# Patient Record
Sex: Male | Born: 1955 | Race: Black or African American | Hispanic: No | Marital: Single | Smoking: Former smoker
Health system: Southern US, Community
[De-identification: ages and names within clinical notes are randomized; demographics above are authoritative.]

## PROBLEM LIST (undated history)

## (undated) DIAGNOSIS — M109 Gout, unspecified: Secondary | ICD-10-CM

## (undated) DIAGNOSIS — I499 Cardiac arrhythmia, unspecified: Secondary | ICD-10-CM

## (undated) HISTORY — PX: CARDIOVERSION: SHX1299

---

## 2019-07-29 ENCOUNTER — Inpatient Hospital Stay (HOSPITAL_COMMUNITY)
Admission: EM | Admit: 2019-07-29 | Discharge: 2019-07-31 | DRG: 176 | Disposition: A | Discharge status: Home / Self Care | Payer: Self-pay | Attending: Family Medicine | Admitting: Family Medicine

## 2019-07-29 ENCOUNTER — Encounter (HOSPITAL_COMMUNITY): Payer: Self-pay | Admitting: Family Medicine

## 2019-07-29 ENCOUNTER — Other Ambulatory Visit: Payer: Self-pay

## 2019-07-29 ENCOUNTER — Emergency Department (HOSPITAL_COMMUNITY): Payer: Self-pay

## 2019-07-29 DIAGNOSIS — I2694 Multiple subsegmental pulmonary emboli without acute cor pulmonale: Principal | ICD-10-CM | POA: Diagnosis present

## 2019-07-29 DIAGNOSIS — Y92009 Unspecified place in unspecified non-institutional (private) residence as the place of occurrence of the external cause: Secondary | ICD-10-CM

## 2019-07-29 DIAGNOSIS — Z20822 Contact with and (suspected) exposure to covid-19: Secondary | ICD-10-CM | POA: Diagnosis present

## 2019-07-29 DIAGNOSIS — I2699 Other pulmonary embolism without acute cor pulmonale: Secondary | ICD-10-CM | POA: Diagnosis present

## 2019-07-29 DIAGNOSIS — J410 Simple chronic bronchitis: Secondary | ICD-10-CM | POA: Diagnosis present

## 2019-07-29 DIAGNOSIS — I445 Left posterior fascicular block: Secondary | ICD-10-CM | POA: Diagnosis present

## 2019-07-29 DIAGNOSIS — I4510 Unspecified right bundle-branch block: Secondary | ICD-10-CM | POA: Diagnosis present

## 2019-07-29 DIAGNOSIS — F17290 Nicotine dependence, other tobacco product, uncomplicated: Secondary | ICD-10-CM | POA: Diagnosis present

## 2019-07-29 DIAGNOSIS — Z86718 Personal history of other venous thrombosis and embolism: Secondary | ICD-10-CM

## 2019-07-29 DIAGNOSIS — Z716 Tobacco abuse counseling: Secondary | ICD-10-CM

## 2019-07-29 DIAGNOSIS — I2601 Septic pulmonary embolism with acute cor pulmonale: Secondary | ICD-10-CM

## 2019-07-29 DIAGNOSIS — Z91120 Patient's intentional underdosing of medication regimen due to financial hardship: Secondary | ICD-10-CM

## 2019-07-29 DIAGNOSIS — R0600 Dyspnea, unspecified: Secondary | ICD-10-CM

## 2019-07-29 DIAGNOSIS — Z8679 Personal history of other diseases of the circulatory system: Secondary | ICD-10-CM | POA: Insufficient documentation

## 2019-07-29 DIAGNOSIS — J189 Pneumonia, unspecified organism: Secondary | ICD-10-CM

## 2019-07-29 DIAGNOSIS — T45516A Underdosing of anticoagulants, initial encounter: Secondary | ICD-10-CM | POA: Diagnosis present

## 2019-07-29 DIAGNOSIS — F17210 Nicotine dependence, cigarettes, uncomplicated: Secondary | ICD-10-CM | POA: Diagnosis present

## 2019-07-29 DIAGNOSIS — R7401 Elevation of levels of liver transaminase levels: Secondary | ICD-10-CM | POA: Diagnosis present

## 2019-07-29 DIAGNOSIS — Z833 Family history of diabetes mellitus: Secondary | ICD-10-CM

## 2019-07-29 DIAGNOSIS — I4891 Unspecified atrial fibrillation: Secondary | ICD-10-CM | POA: Diagnosis present

## 2019-07-29 DIAGNOSIS — I77810 Thoracic aortic ectasia: Secondary | ICD-10-CM | POA: Diagnosis present

## 2019-07-29 HISTORY — DX: Cardiac arrhythmia, unspecified: I49.9

## 2019-07-29 LAB — URINALYSIS, ROUTINE W REFLEX MICROSCOPIC
Bilirubin Urine: NEGATIVE
Glucose, UA: NEGATIVE mg/dL
Ketones, ur: NEGATIVE mg/dL
Nitrite: NEGATIVE
Protein, ur: 100 mg/dL — AB
Specific Gravity, Urine: >1.046 — ABNORMAL HIGH (ref 1.005–1.030)
pH: 6 (ref 5.0–8.0)

## 2019-07-29 LAB — LACTIC ACID, PLASMA: Lactic Acid, Venous: 1.3 mmol/L (ref 0.5–1.9)

## 2019-07-29 LAB — CBC
HCT: 45.6 % (ref 39.0–52.0)
Hemoglobin: 14.2 g/dL (ref 13.0–17.0)
MCH: 28.2 pg (ref 26.0–34.0)
MCHC: 31.1 g/dL (ref 30.0–36.0)
MCV: 90.5 fL (ref 80.0–100.0)
Platelets: 239 10*3/uL (ref 150–400)
RBC: 5.04 MIL/uL (ref 4.22–5.81)
RDW: 15.9 % — ABNORMAL HIGH (ref 11.5–15.5)
WBC: 11.4 10*3/uL — ABNORMAL HIGH (ref 4.0–10.5)
nRBC: 0 % (ref 0.0–0.2)

## 2019-07-29 LAB — BASIC METABOLIC PANEL
Anion gap: 13 (ref 5–15)
BUN: 6 mg/dL — ABNORMAL LOW (ref 8–23)
CO2: 24 mmol/L (ref 22–32)
Calcium: 10.1 mg/dL (ref 8.9–10.3)
Chloride: 100 mmol/L (ref 98–111)
Creatinine, Ser: 1.07 mg/dL (ref 0.61–1.24)
GFR calc Af Amer: >60 mL/min (ref >60)
GFR calc non Af Amer: >60 mL/min (ref >60)
Glucose, Bld: 120 mg/dL — ABNORMAL HIGH (ref 70–99)
Potassium: 3.6 mmol/L (ref 3.5–5.1)
Sodium: 137 mmol/L (ref 135–145)

## 2019-07-29 LAB — HEPARIN LEVEL (UNFRACTIONATED): Heparin Unfractionated: 0.46 IU/mL (ref 0.30–0.70)

## 2019-07-29 LAB — POC SARS CORONAVIRUS 2 AG -  ED: SARS Coronavirus 2 Ag: NEGATIVE

## 2019-07-29 LAB — TROPONIN I (HIGH SENSITIVITY)
Troponin I (High Sensitivity): 14 ng/L (ref <18)
Troponin I (High Sensitivity): 14 ng/L (ref <18)

## 2019-07-29 LAB — RESPIRATORY PANEL BY RT PCR (FLU A&B, COVID)
Influenza A by PCR: NEGATIVE
Influenza B by PCR: NEGATIVE
SARS Coronavirus 2 by RT PCR: NEGATIVE

## 2019-07-29 LAB — BRAIN NATRIURETIC PEPTIDE: B Natriuretic Peptide: 55.7 pg/mL (ref 0.0–100.0)

## 2019-07-29 LAB — HIV ANTIBODY (ROUTINE TESTING W REFLEX): HIV Screen 4th Generation wRfx: NONREACTIVE

## 2019-07-29 MED ORDER — SODIUM CHLORIDE 0.9 % IV SOLN
1.0000 g | INTRAVENOUS | Status: DC
  Administered 2019-07-29: 1 g via INTRAVENOUS
  Filled 2019-07-29: qty 10

## 2019-07-29 MED ORDER — HEPARIN (PORCINE) 25000 UT/250ML-% IV SOLN
1600.0000 [IU]/h | INTRAVENOUS | Status: DC
  Administered 2019-07-29 – 2019-07-30 (×2): 1600 [IU]/h via INTRAVENOUS
  Filled 2019-07-29 (×2): qty 250

## 2019-07-29 MED ORDER — SODIUM CHLORIDE 0.9% FLUSH
3.0000 mL | Freq: Once | INTRAVENOUS | Status: AC
  Administered 2019-07-29: 3 mL via INTRAVENOUS

## 2019-07-29 MED ORDER — ACETAMINOPHEN 325 MG PO TABS: 650.0000 mg | ORAL_TABLET | Freq: Four times a day (QID) | ORAL | Status: DC | PRN

## 2019-07-29 MED ORDER — NICOTINE 7 MG/24HR TD PT24
7.0000 mg | MEDICATED_PATCH | Freq: Every day | TRANSDERMAL | Status: DC
  Administered 2019-07-29 – 2019-07-31 (×3): 7 mg via TRANSDERMAL
  Filled 2019-07-29 (×3): qty 1

## 2019-07-29 MED ORDER — LIDOCAINE 5 % EX PTCH
1.0000 | MEDICATED_PATCH | CUTANEOUS | Status: DC
  Administered 2019-07-29 – 2019-07-31 (×3): 1 via TRANSDERMAL
  Filled 2019-07-29 (×5): qty 1

## 2019-07-29 MED ORDER — SODIUM CHLORIDE 0.9 % IV SOLN
500.0000 mg | INTRAVENOUS | Status: DC
  Administered 2019-07-29: 500 mg via INTRAVENOUS
  Filled 2019-07-29: qty 500

## 2019-07-29 MED ORDER — HEPARIN BOLUS VIA INFUSION
7000.0000 [IU] | Freq: Once | INTRAVENOUS | Status: AC
  Administered 2019-07-29: 7000 [IU] via INTRAVENOUS
  Filled 2019-07-29: qty 7000

## 2019-07-29 MED ORDER — IOHEXOL 350 MG/ML SOLN
100.0000 mL | Freq: Once | INTRAVENOUS | Status: AC | PRN
  Administered 2019-07-29: 100 mL via INTRAVENOUS

## 2019-07-29 NOTE — ED Notes (Signed)
Tele Dinner ordered 

## 2019-07-29 NOTE — H&P (Addendum)
Family Medicine Teaching [facility] Admission History and Physical Service Pager: [phone]  Patient name: Patrick Warner Medical record number: [id number] Date of birth: 12/11/1955 Age: 64 y.o. Gender: male  Primary Care Provider: Patient, No Pcp Per Consultants: None Code Status: Full code Preferred Emergency Contact:  Contact Information    Name Relation Home Work Mobile   Foley,shanika Daughter   [phone]     Chief Complaint: SOB  Major events 5/5: Admitted for pulmonary emboli, heparin drip started, azithromycin and ceftriaxone for CAP  Assessment and Plan: Patrick Warner is a 64 y.o. male presenting with SOB, found to have PE. PMH is significant for afib and DVT.    Dyspnea 2/2 Submassive Bilateral Pulmonary Emboli Patient presented to ED with shortness of breath which started suddenly 5 days ago. Initial vitals significant for tachycardia to 106, and tachypnea a up to 37.   On exam, patient noted to have some decreased basal lung sounds bilaterally and is on 2 L via nasal cannula.  Patient does not use supplemental oxygen at home. CTA confirmed extensive bilateral lobar, segmental, subsegmental PEs in the bilateral lower lobe pulmonary arteries, right worse than left.  CTA also showed RV to LV ratio of 1.0 and RBBB indicating right heart strain from submassive PE. pt placed on heparin drip in the ED.  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 No swelling/pain in any extremity to indicate where clot originated this time.  ChadsVasc score of 2. CXR/CTA indicated possibility of pneumonia and pt was given  CTX and azithromycin in the ED, but given lack of fever/leukocytosis or other signs of PNA, this is unlikely. Will d/c abx.  Patient requires admission for anticoagulation initiation.  Given his lack of insurance and PCP, warfarin will be the most likely [facility] used as outpatient. Will need to bridge to  warfarin while in hospital.   - admit to MedSurg, attending Dr. Pollie Meyer  - cardiac monitoring  - continuous pulse oximetry  - Echocardiogram - Continue heparin drip, will transition to oral anticoagulation likely  Coumadin  - Heparin per pharmacy - HIV, heparin level, lipid panel, PT/INR, U/A, CBC, CMP, hemoglobin A1c - consult to [facility] for PCP needs.  - vitals per unit  - Up with assistance - PT/OT - Tylenol PRN   Questionable hx of Atrial fibrillation Patient reports history of heart fibrillation and states that he was previously prescribed Coumadin in 2017 but stopped taking it due to cost. Patient has not been anticoagulated since then as he takes no regular medications. Appears to be in regular rhythm today.  No previous ekg's/notes to refer to.   -Patient currently on heparin drip for pulmonary emboli. See [facility] plan above.  -Patient on cardiac monitoring -A.m. EKG   History of left lower extremity DVT?  LUE pain   Patient also reports history of DVT and left lower extremity.  Do not note any erythema or edema in left lower extremity on admission today, patient does not report any tenderness to palpation of this extremity.  Patient does state that he has soreness in his left upper extremity near his olecranon with no signs of edema, skin changes nor erythema. Likely MSK in nature -Tylenol PRN  -lidocaine patch PRN  -K pad  -Consider DVT ultrasound of left upper extremity  Tobacco use disorder Pt smokes approximately 2/3rds of a pack per day. Contributing factor to his PE.  - encourage  smoking cessation - nicotine replacement therapy.   Patient without primary care physician Patient states that he has not seen a primary care physician nor cardiologist since 2017. -Consult TOC for PCP.  FEN/GI: Regular diet  Prophylaxis: On anticoagulation, heparin  Disposition: Admit to MedSurg   History of Present Illness:  Patrick Warner is a 64 y.o. male presenting with shortness  of breath found to have bilateral pulmonary embolism and pneumonia.  Has medical history significant for abnormal heart rhythm.  Patient states that he was sitting in a chair at home 5 days ago when he suddenly started feeling short of breath.  Patient's daughter is at bedside and reports that the patient complained of shortness of breath but wanted to wait until the next weekend before being evaluated.  When asked if patient feels shortness of breath has progressively worsened, he reports it feels " about the same".  Patient reports no previous pulmonary emboli but does state that about 4 years ago he was told that he had clots and he is left lower extremity and at that time was told he had a "fibrillation in my heart".  Patient also adds that he was prescribed warfarin at that time but had to stop taking it as he could no longer afford the medication.  Patient reports that he does not have a PCP or cardiologist at this time.  Reports that the last time he saw a cardiologist was around 2017 in [facility]  He now lives in here in [place] with his 41 year old daughter and 20 year old granddaughter.  Patient denies taking any current medications.  Sometimes takes walks with granddaughter, does not spend most of time sitting throughout the day.  Patient denies recent long flight or car ride and reports that he ambulates independently.Denies any recent surgeries. Patient states that he has reports frequent swelling in left knee occurs intermittently in addition to some left hip pain.  He denies dizziness but does report that sometimes he feels that his left hip is not steady when he stands quickly.  Review of systems notable for no known sick contacts and recently developed hemoptysis later in the afternoon 5 days ago that has been persistent.  Reports a chronic cough with phlegm but never has been bloody, until this week.    No known drug allergies  ED Course:  Patient vitals remarkable for  tachycardia to 106 on initial presentation, tachypnea to 22 and normotensive. Pulmonary embolism confirmed on CTA noted as  extensive bilateral lobar, segmental, and subsegmental pulmonary emboli within the bilateral lower lobe pulmonary arteries, right worse than left. There is also involvement of a segmental branch artery of the right upper lobe. Patient was started on heparin gtt and treated with azithromycin and CTX for presumed CAP with CXR findings of  Ill-defined airspace opacity in the right mid and lower lung regions consistent pneumonia involving several lung segments. Bibasilar atelectasis with small left pleural effusion.  Review Of Systems: Per HPI with the following additions:   Review of Systems  Constitutional: Positive for appetite change. Negative for chills, fever and unexpected weight change.  HENT: Negative for rhinorrhea, sneezing and sore throat.   Eyes: Negative for pain.  Respiratory: Positive for cough, shortness of breath and wheezing.        Hemoptysis   Cardiovascular: Positive for palpitations. Negative for chest pain.  Gastrointestinal: Negative for abdominal pain, blood in stool, constipation, diarrhea and nausea.  Genitourinary: Negative for dysuria and hematuria.  Musculoskeletal: Positive for back pain.  Skin: Negative for rash.  Neurological: Negative for seizures, weakness and headaches.     Patient Active Problem List   Diagnosis Date Noted  . Pulmonary embolism (HCC) 07/29/2019  . Dyspnea   . Hx of atrial fibrillation without current medication   . Community acquired pneumonia of right lower lobe of lung     Past Medical History: Past Medical History:  Diagnosis Date  . Abnormal heart rhythm    "fibrillation of heart", diagnosed 4 years ago    Past Surgical History: Past Surgical History:  Procedure Laterality Date  . CARDIOVERSION     around 2017    Social History: Social History   Tobacco Use  . Smoking status: Current Every Day  Smoker    Packs/day: 0.50    Years: 45.00    Pack years: 22.50    Types: Cigarettes, Cigars  . Smokeless tobacco: Never Used  Substance Use Topics  . Alcohol use: Yes    Alcohol/week: 12.0 standard drinks    Types: 12 Cans of beer per week    Comment: 4 drinks per day on weekends   . Drug use: Never   Additional social history: patient lives with his daughter and granddaughter in [place], unemployed currently last employed in 2020; ambulates independently   Family History: Family History  Problem Relation Age of Onset  . Diabetes Mother      Allergies and Medications: No Known Allergies No current facility-administered medications on file prior to encounter.   No current outpatient medications on file prior to encounter.    Objective: BP 125/90   Pulse 78   Temp 98.4 F (36.9 C) (Oral)   Resp (!) 21   Ht 6\' 2"  (1.88 m)   Wt 102.1 kg   SpO2 99%   BMI 28.89 kg/m   Exam: General: Male appearing stated age, lying in bed, in no acute distress, abnormal weight Eyes: Mild scleral, no conjunctival injection ENTM: No oropharyngeal erythema, some missing teeth on bottom row, no oral lesions appreciated Cardiovascular: Regular rate and rhythm, did not appreciate murmurs on admission exam, radial pulses palpable bilaterally Respiratory: Some decreased breath sounds at bases of lung fields, no wheezing, no increased work of breathing, patient stable on 2 L nasal cannula Gastrointestinal: No tenderness to palpation of abdomen in 4 quadrants, bowel sounds occasional, no surgical scarring MSK: Moves extremities with normal range of motion, did not note any abnormalities on visual inspection, left upper extremity somewhat limited range of motion secondary to pain per patient Derm: No unhealing wounds or lesions appreciated on exam Neuro: A&O x4 Psych: Pleasantly conversational, no pressured speech, thought content is linear  Labs and Imaging: CBC BMET  Recent Labs  Lab  07/29/19 0858  WBC 11.4*  HGB 14.2  HCT 45.6  PLT 239   Recent Labs  Lab 07/29/19 0858  NA 137  K 3.6  CL 100  CO2 24  BUN 6*  CREATININE 1.07  GLUCOSE 120*  CALCIUM 10.1     EKG: QTC 467, right bundle branch block, HR 87  DG Chest 2 View  Result Date: 07/29/2019 CLINICAL DATA:  Shortness of breath EXAM: CHEST - 2 VIEW COMPARISON:  None. FINDINGS: There is atelectatic change bilaterally with small left pleural effusion. There is ill-defined opacity in the right mid and lower lung regions. Heart size and pulmonary vascularity are normal. No adenopathy. There is degenerative change in the thoracic spine. IMPRESSION: Ill-defined airspace opacity in the right  mid and lower lung regions consistent pneumonia involving several lung segments. Bibasilar atelectasis with small left pleural effusion. Cardiac silhouette normal.  No adenopathy. These findings may warrant correlation with COVID-19 status. Electronically Signed   By: Lowella Grip III M.D.   On: 07/29/2019 09:36   CT Angio Chest PE W/Cm &/Or Wo Cm  Result Date: 07/29/2019 CLINICAL DATA:  Shortness of breath EXAM: CT ANGIOGRAPHY CHEST WITH CONTRAST TECHNIQUE: Multidetector CT imaging of the chest was performed using the standard protocol during bolus administration of intravenous contrast. Multiplanar CT image reconstructions and MIPs were obtained to evaluate the vascular anatomy. CONTRAST:  118mL OMNIPAQUE IOHEXOL 350 MG/ML SOLN COMPARISON:  X-ray 07/29/2019 FINDINGS: Cardiovascular: Prominent thrombus within the lobar, segmental, and subsegmental branches of the right lower lobe (series 6, image 68). Additional thrombi within segmental and subsegmental branch pulmonary arteries of the left lower lobe (series 6, image 74). Segmental branch right upper lobe filling defect (series 6, image 57). No left upper lobe pulmonary arterial filling defects. No saddle embolism. RV to LV ratio of 1.0 with flattening of the interventricular septum.  Heart size within normal limits. No pericardial effusion. Thoracic aorta is nonaneurysmal. Scattered coronary artery calcification. Mediastinum/Nodes: No enlarged mediastinal, hilar, or axillary lymph nodes. Thyroid gland, trachea, and esophagus demonstrate no significant findings. Lungs/Pleura: Patchy consolidation and ground-glass opacity within the right lower lobe. Extensive bibasilar atelectasis. No pleural effusion or pneumothorax. Upper Abdomen: No acute findings. Musculoskeletal: No chest wall abnormality. No acute or significant osseous findings. Review of the MIP images confirms the above findings. IMPRESSION: 1. Extensive bilateral lobar, segmental, and subsegmental pulmonary emboli within the bilateral lower lobe pulmonary arteries, right worse than left. There is also involvement of a segmental branch artery of the right upper lobe. 2. RV to LV ratio of 1.0, which can be seen with right heart strain. 3. Patchy consolidation and ground-glass opacity within the right lower lobe may represent pneumonia versus pulmonary infarction. 4. Extensive bibasilar atelectasis. These results were called by telephone at the time of interpretation on 07/29/2019 at 12:58 pm to provider Lacretia Leigh , who verbally acknowledged these results. Electronically Signed   By: Davina Poke D.O.   On: 07/29/2019 13:00    Stark Klein, MD 07/29/2019, 4:22 PM PGY-1, [place] Intern pager: [phone], text pages welcome  Resident Addendum I have separately seen and examined the patient.  I have discussed the findings and exam with the resident and agree with the above note.  I helped develop the management plan that is described in the resident's note and I agree with the content.  Changes have been made in BLUE.    Addison Naegeli, MD PGY-2 [facility] residency program

## 2019-07-29 NOTE — Progress Notes (Signed)
ANTICOAGULATION CONSULT NOTE  Pharmacy Consult for Heparin Indication: pulmonary embolus  No Known Allergies  Patient Measurements: Height: 6\' 2"  (188 cm) Weight: 102.1 kg (225 lb) IBW/kg (Calculated) : 82.2 Heparin Dosing Weight: 102.1 kg  Vital Signs: Temp: 97.7 F (36.5 C) (05/05 1208) Temp Source: Oral (05/05 1208) BP: 127/86 (05/05 1215) Pulse Rate: 77 (05/05 1215)  Labs: Recent Labs    07/29/19 0858 07/29/19 1140  HGB 14.2  --   HCT 45.6  --   PLT 239  --   CREATININE 1.07  --   TROPONINIHS 14 14    Estimated Creatinine Clearance: 91.3 mL/min (by C-G formula based on SCr of 1.07 mg/dL).   Medical History: No past medical history on file.  Medications:  Scheduled:  . heparin  7,000 Units Intravenous Once  . sodium chloride flush  3 mL Intravenous Once    Assessment: Patient is a 89 yom that presented to the ED with SOB. He has a Hx of PE in the past and was suppose to be on Warfarin for this however has not been complaint. Patient was found to have extensive PEs with suggestion of right heart strain. Pharmacy has been asked to dose heparin.   Goal of Therapy:  Heparin level 0.3-0.7 units/ml Monitor platelets by anticoagulation protocol: Yes   Plan:  - Heparin bolus 7000 units  IV x 1 dose - Heparin drip @ 1600 units/hr - Heparin level in ~ 6 hours  - Monitor patient for s/s of bleeding and CBC while on heparin   68 PharmD. BCPS  07/29/2019,1:12 PM .

## 2019-07-29 NOTE — ED Provider Notes (Signed)
[facility] EMERGENCY DEPARTMENT Provider Note   CSN: [id number] Arrival date & time: 07/29/19  1856     History Chief Complaint  Patient presents with  . Shortness of Breath    Patrick Warner is a 64 y.o. male.  64 year old male presents with several days of shortness of breath.  History of PE in the past which occurred several years ago and patient was told he needs to be alive time Coumadin but has not been compliant with this.  States has had pleuritic pain.  Has noted some pain to his left leg as well.  No fever or chills.  His dyspnea is worse with exertion.  Patient has had a slight productive cough.  Symptoms better with remaining still no treatment use prior to arrival        No past medical history on file.  There are no problems to display for this patient.        No family history on file.  Social History   Tobacco Use  . Smoking status: Not on file  Substance Use Topics  . Alcohol use: Not on file  . Drug use: Not on file    Home Medications Prior to Admission medications   Not on File    Allergies    Patient has no allergy information on record.  Review of Systems   Review of Systems  All other systems reviewed and are negative.   Physical Exam Updated Vital Signs BP 126/80   Pulse (!) 106   Temp 98.4 F (36.9 C)   Resp (!) 22   SpO2 97%   Physical Exam Vitals and nursing note reviewed.  Constitutional:      General: He is not in acute distress.    Appearance: Normal appearance. He is well-developed. He is not toxic-appearing.  HENT:     Head: Normocephalic and atraumatic.  Eyes:     General: Lids are normal.     Conjunctiva/sclera: Conjunctivae normal.     Pupils: Pupils are equal, round, and reactive to light.  Neck:     Thyroid: No thyroid mass.     Trachea: No tracheal deviation.  Cardiovascular:     Rate and Rhythm: Normal rate and regular rhythm.     Heart sounds: Normal heart sounds. No  murmur. No gallop.   Pulmonary:     Effort: Pulmonary effort is normal. No respiratory distress.     Breath sounds: No stridor. Decreased breath sounds present. No wheezing, rhonchi or rales.  Abdominal:     General: Bowel sounds are normal. There is no distension.     Palpations: Abdomen is soft.     Tenderness: There is no abdominal tenderness. There is no rebound.  Musculoskeletal:        General: No tenderness. Normal range of motion.     Cervical back: Normal range of motion and neck supple.  Skin:    General: Skin is warm and dry.     Findings: No abrasion or rash.  Neurological:     Mental Status: He is alert and oriented to person, place, and time.     GCS: GCS eye subscore is 4. GCS verbal subscore is 5. GCS motor subscore is 6.     Cranial Nerves: No cranial nerve deficit.     Sensory: No sensory deficit.  Psychiatric:        Speech: Speech normal.        Behavior: Behavior normal.  ED Results / Procedures / Treatments   Labs (all labs ordered are listed, but only abnormal results are displayed) Labs Reviewed  CBC - Abnormal; Notable for the following components:      Result Value   WBC 11.4 (*)    RDW 15.9 (*)    All other components within normal limits  BASIC METABOLIC PANEL  TROPONIN I (HIGH SENSITIVITY)    EKG EKG Interpretation  Date/Time:  Wednesday Jul 29 2019 08:46:21 EDT Ventricular Rate:  106 PR Interval:  142 QRS Duration: 146 QT Interval:  412 QTC Calculation: 547 R Axis:   124 Text Interpretation: Sinus tachycardia Right bundle branch block Left posterior fascicular block Possible Inferior infarct , age undetermined Abnormal ECG Confirmed by Gerlene Fee [phone]) on 07/29/2019 8:56:04 AM   Radiology No results found.  Procedures Procedures (including critical care time)  Medications Ordered in ED Medications  sodium chloride flush (NS) 0.9 % injection 3 mL (has no administration in time range)    ED Course  I have reviewed the  triage vital signs and the nursing notes.  Pertinent labs & imaging results that were available during my care of the patient were reviewed by me and considered in my medical decision making (see chart for details).    MDM Rules/Calculators/A&P                      Patient's chest x-ray findings noted and concerning for possible pneumonia. Patient started on empiric antibiotics. Subsequent CT of the chest consistent with bilateral pulmonary embolus with right heart strain. Patient started on heparin for pharmacy and will consult medicine for admission. Patient has no signs of severe hypoxemia at this time.   CRITICAL CARE Performed by: Leota Jacobsen Total critical care time: 45 minutes Critical care time was exclusive of separately billable procedures and treating other patients. Critical care was necessary to treat or prevent imminent or life-threatening deterioration. Critical care was time spent personally by me on the following activities: development of treatment plan with patient and/or surrogate as well as nursing, discussions with consultants, evaluation of patient's response to treatment, examination of patient, obtaining history from patient or surrogate, ordering and performing treatments and interventions, ordering and review of laboratory studies, ordering and review of radiographic studies, pulse oximetry and re-evaluation of patient's condition.  Final Clinical Impression(s) / ED Diagnoses Final diagnoses:  None    Rx / DC Orders ED Discharge Orders    None       Lacretia Leigh, MD 07/29/19 1301

## 2019-07-29 NOTE — ED Notes (Signed)
Report called to rn on 2w 

## 2019-07-29 NOTE — ED Triage Notes (Signed)
Pt arrives pov with shob since Sunday. Pt speaking in complete sentences. Endorses productive cough.

## 2019-07-29 NOTE — ED Notes (Signed)
Pt moved to a recliner at   The bedside  Pt tired of lying on a stretcher  Regular hospital bed requested

## 2019-07-29 NOTE — ED Notes (Signed)
Hospital bed ordered for patient.

## 2019-07-29 NOTE — Progress Notes (Signed)
ANTICOAGULATION CONSULT NOTE  Pharmacy Consult for Heparin Indication: pulmonary embolus  No Known Allergies  Patient Measurements: Height: 6\' 2"  (188 cm) Weight: 102.1 kg (225 lb) IBW/kg (Calculated) : 82.2 Heparin Dosing Weight: 102.1 kg  Vital Signs: Temp: 98.4 F (36.9 C) (05/05 1522) Temp Source: Oral (05/05 1522) BP: 115/78 (05/05 2000) Pulse Rate: 78 (05/05 2000)  Labs: Recent Labs    07/29/19 0858 07/29/19 1140 07/29/19 2142  HGB 14.2  --   --   HCT 45.6  --   --   PLT 239  --   --   HEPARINUNFRC  --   --  0.46  CREATININE 1.07  --   --   TROPONINIHS 14 14  --     Estimated Creatinine Clearance: 91.3 mL/min (by C-G formula based on SCr of 1.07 mg/dL).   Medical History: Past Medical History:  Diagnosis Date  . Abnormal heart rhythm    "fibrillation of heart", diagnosed 4 years ago    Medications:  Scheduled:  . lidocaine  1 patch Transdermal Q24H  . nicotine  7 mg Transdermal Daily    Assessment: Patient is a 40 yom that presented to the ED with SOB. He has a Hx of PE in the past and was suppose to be on Warfarin for this however has not been complaint. Patient was found to have extensive PEs with suggestion of right heart strain. Pharmacy has been asked to dose heparin.   5/5 PM update:  Heparin level therapeutic   Goal of Therapy:  Heparin level 0.3-0.7 units/ml Monitor platelets by anticoagulation protocol: Yes   Plan:  -Cont heparin drip at 1600 units/hr -Confirmatory heparin level with AM labs  68, PharmD, BCPS Clinical Pharmacist Phone: [phone]

## 2019-07-30 ENCOUNTER — Inpatient Hospital Stay (HOSPITAL_COMMUNITY): Payer: Self-pay

## 2019-07-30 DIAGNOSIS — I2609 Other pulmonary embolism with acute cor pulmonale: Secondary | ICD-10-CM

## 2019-07-30 DIAGNOSIS — R9431 Abnormal electrocardiogram [ECG] [EKG]: Secondary | ICD-10-CM

## 2019-07-30 LAB — CBC WITH DIFFERENTIAL/PLATELET
Abs Immature Granulocytes: 0.03 10*3/uL (ref 0.00–0.07)
Basophils Absolute: 0 10*3/uL (ref 0.0–0.1)
Basophils Relative: 0 %
Eosinophils Absolute: 0 10*3/uL (ref 0.0–0.5)
Eosinophils Relative: 0 %
HCT: 40 % (ref 39.0–52.0)
Hemoglobin: 12.4 g/dL — ABNORMAL LOW (ref 13.0–17.0)
Immature Granulocytes: 0 %
Lymphocytes Relative: 16 %
Lymphs Abs: 1.8 10*3/uL (ref 0.7–4.0)
MCH: 28 pg (ref 26.0–34.0)
MCHC: 31 g/dL (ref 30.0–36.0)
MCV: 90.3 fL (ref 80.0–100.0)
Monocytes Absolute: 1.3 10*3/uL — ABNORMAL HIGH (ref 0.1–1.0)
Monocytes Relative: 12 %
Neutro Abs: 7.6 10*3/uL (ref 1.7–7.7)
Neutrophils Relative %: 72 %
Platelets: 210 10*3/uL (ref 150–400)
RBC: 4.43 MIL/uL (ref 4.22–5.81)
RDW: 15.8 % — ABNORMAL HIGH (ref 11.5–15.5)
WBC: 10.7 10*3/uL — ABNORMAL HIGH (ref 4.0–10.5)
nRBC: 0 % (ref 0.0–0.2)

## 2019-07-30 LAB — LIPID PANEL
Cholesterol: 114 mg/dL (ref 0–200)
HDL: 41 mg/dL (ref >40)
LDL Cholesterol: 62 mg/dL (ref 0–99)
Total CHOL/HDL Ratio: 2.8 RATIO
Triglycerides: 54 mg/dL (ref <150)
VLDL: 11 mg/dL (ref 0–40)

## 2019-07-30 LAB — COMPREHENSIVE METABOLIC PANEL
ALT: 47 U/L — ABNORMAL HIGH (ref 0–44)
AST: 59 U/L — ABNORMAL HIGH (ref 15–41)
Albumin: 2.5 g/dL — ABNORMAL LOW (ref 3.5–5.0)
Alkaline Phosphatase: 86 U/L (ref 38–126)
Anion gap: 10 (ref 5–15)
BUN: 7 mg/dL — ABNORMAL LOW (ref 8–23)
CO2: 23 mmol/L (ref 22–32)
Calcium: 9.3 mg/dL (ref 8.9–10.3)
Chloride: 104 mmol/L (ref 98–111)
Creatinine, Ser: 1 mg/dL (ref 0.61–1.24)
GFR calc Af Amer: >60 mL/min (ref >60)
GFR calc non Af Amer: >60 mL/min (ref >60)
Glucose, Bld: 100 mg/dL — ABNORMAL HIGH (ref 70–99)
Potassium: 3.8 mmol/L (ref 3.5–5.1)
Sodium: 137 mmol/L (ref 135–145)
Total Bilirubin: 0.7 mg/dL (ref 0.3–1.2)
Total Protein: 7.1 g/dL (ref 6.5–8.1)

## 2019-07-30 LAB — ECHOCARDIOGRAM COMPLETE
Height: 74 in
Weight: 3600 oz

## 2019-07-30 LAB — PROTIME-INR
INR: 1.2 (ref 0.8–1.2)
Prothrombin Time: 14.5 seconds (ref 11.4–15.2)

## 2019-07-30 LAB — HEPARIN LEVEL (UNFRACTIONATED): Heparin Unfractionated: 0.35 IU/mL (ref 0.30–0.70)

## 2019-07-30 LAB — HEMOGLOBIN A1C
Hgb A1c MFr Bld: 5.4 % (ref 4.8–5.6)
Mean Plasma Glucose: 108.28 mg/dL

## 2019-07-30 LAB — APTT: aPTT: 84 seconds — ABNORMAL HIGH (ref 24–36)

## 2019-07-30 MED ORDER — APIXABAN 5 MG PO TABS: 5.0000 mg | ORAL_TABLET | Freq: Two times a day (BID) | ORAL | Status: DC

## 2019-07-30 MED ORDER — APIXABAN 5 MG PO TABS
10.0000 mg | ORAL_TABLET | Freq: Two times a day (BID) | ORAL | Status: DC
  Administered 2019-07-30 – 2019-07-31 (×3): 10 mg via ORAL
  Filled 2019-07-30 (×3): qty 2

## 2019-07-30 NOTE — Progress Notes (Signed)
PT Cancellation Note  Patient Details Name: Emit Kuenzel Klett MRN: [id number] DOB: Jul 24, 1955   Cancelled Treatment:    Reason Eval/Treat Not Completed: Patient not medically ready - pt with acute PE, heparin infusion starting yesterday ~1500. Will check back at 24 hour mark for safe mobility per rehab protocol.   Richrd Sox, PT Acute Rehabilitation Services Pager [phone]  Office [phone]    Tyrone Apple D Despina Hidden 07/30/2019, 10:49 AM

## 2019-07-30 NOTE — Hospital Course (Addendum)
Patrick Warner is a 64 y.o. male with possible hx of atrial fibrillation who presented to the ED with SOB for five days found to have bilateral pulmonary emboli.   Submassive Pulmonary Emboli Patient presented to the ED with SOB, was treated with supplental oxygen and underwent CXR with concern for CAP. He was treated with azithro and ctx for one time dosing but given that he was afebrile and without leukocytosis and CTA suggested pulmonary findings were potentially due to infarction from emboli, these antibiotics were not continued as patient was thought to not have pneumonia. Patient did report new hemoptysis in addition to his chronic smoker's cough. EKG on admission showed right bundle branch block and no atrial fibrillation nor signs of ischemia with troponin levels that were unremarkable at 14 and 14. CTA was completed and showed Extensive bilateral lobar, segmental, and subsegmental pulmonary emboli within the bilateral lower lobe pulmonary arteries with involvement of a segmental branchartery of the right upper lobe. CTA had RV to LV ratio of 1.0, usually seen with right heart strain as well as patchy consolidation and ground-glass opacity within the right lower lobe may have represented pneumonia versus pulmonary infarction.The patient was started on heparin drip and on 5/6, was transitioned to Eliquis. Patient had no PCP on admission so TOC was conuslted to assist in getting the patient established with the community [facility] wellness [facility]. Patient was scheduled for follow up at the time of discharge. Echocardiogram was completed and showed LVEF 60-65% with some aortic root dilation. Patient was also evaluated by physical therapy while admitted with no follow up recommendations other than supplemental oxygen as he desaturated to 81% with ambulation and maintained sats of 98% with 2 liters Oxygen while walking. Orders for home oxygen were placed prior to discharge.

## 2019-07-30 NOTE — Progress Notes (Signed)
Family Medicine Teaching Service Daily Progress Note Intern Pager: [phone]  Patient name: Patrick Warner Medical record number: [id number] Date of birth: 1955-08-01 Age: 64 y.o. Gender: male  Primary Care Provider: Patient, No Pcp Per Consultants: Pharmacy, heparin Code Status: Full code  Pt Overview and Major Events to Date:  5/5: CTA confirmed pulmonary embolus, patient started on heparin drip  Assessment and Plan: Patrick Warner is a 64 y.o. male presenting with SOB, found to have PE. PMH is significant for afib? and LLE DVT.  Dyspnea 2/2 Submassive Bilateral Pulmonary Emboli Patient presented with pulmonary emboli and started on heparin drip overnight.  Heparin level is therapeutic this morning and will be managed by pharmacy.  Patient reports that his breathing has improved overnight. APTT is elevated at 84 this morning.  Hemoglobin down from 14.2-12.4.  Platelets within normal limits at 210 from 239 on admission. -Continue heparin drip -Plan to transition to oral anticoagulation, discussed options for oral anticoagulation and costs with both patient and pharmacy -will work to have patient set up with community health and wellness to see if patient qualifies for payment assistance  -Consider starting Xarelto 10 mg twice daily for 21 days and then transition to 20 mg daily  Questionable hx of Atrial fibrillation Patient reports history of atrial fibrillation.  Did not have prior EKGs to compare.  Patient is not noted to be in atrial fibrillation on admission. -Cardiac monitoring -Continue to monitor heart rate with vitals  History of left lower extremity DVT?  LUE pain   Patient reports feeling sore all over.  States that he has been like this for a few weeks and attributed to not exercising regularly. On exam, do not note any erythema or swelling any signs of DVT.  Patient is moving left upper extremity with normal range of motion today. Left back pain. Do not note  any abnormalities on exam and discomfort is not reproducible with palpation.  Did not note any erythema or rashes.  Pain likely musculoskeletal, no aortic dissection on CTA. -monitor LUE pain    Tobacco use disorder Pt smokes approximately 2/3rds of a pack per day. Contributing factor to his PE.  - encourage smoking cessation - nicotine replacement therapy  FEN/GI: regular diet  PPx: on hep gtt   Disposition: discharge pending successful transition to PO ac   Subjective:  Patient states that his breathing is improved this morning.  He reports some discomfort on the left side of his back.  Denies chest pain.  Objective: Temp:  [97.7 F (36.5 C)-98.6 F (37 C)] 98.1 F (36.7 C) (05/06 0736) Pulse Rate:  [42-88] 78 (05/06 0736) Resp:  [18-37] 18 (05/06 0736) BP: (113-152)/(71-102) 126/77 (05/06 0736) SpO2:  [97 %-100 %] 99 % (05/06 0736) Weight:  [102.1 kg] 102.1 kg (05/05 1300)  Physical Exam: General: Male appearing stated age sitting in bed in no acute distress Cardiovascular: Regular rate and rhythm, no murmurs appreciated Respiratory: Some decrease breath sounds at right base but no crackles, no wheezing, patient on 3 L nasal cannula Abdomen: Soft and nontender with bowel sounds present throughout Extremities: Trace edema on left lower extremity, left upper extremity reduce edema  Laboratory: Recent Labs  Lab 07/29/19 0858 07/30/19 0319  WBC 11.4* 10.7*  HGB 14.2 12.4*  HCT 45.6 40.0  PLT 239 210   Recent Labs  Lab 07/29/19 0858 07/30/19 0319  NA 137 137  K 3.6 3.8  CL 100 104  CO2 24 23  BUN 6*  7*  CREATININE 1.07 1.00  CALCIUM 10.1 9.3  PROT  --  7.1  BILITOT  --  0.7  ALKPHOS  --  86  ALT  --  47*  AST  --  59*  GLUCOSE 120* 100*    CT Angio Chest PE W/Cm &/Or Wo Cm  Result Date: 07/29/2019 CLINICAL DATA:  Shortness of breath EXAM: CT ANGIOGRAPHY CHEST WITH CONTRAST TECHNIQUE: Multidetector CT imaging of the chest was performed using the standard  protocol during bolus administration of intravenous contrast. Multiplanar CT image reconstructions and MIPs were obtained to evaluate the vascular anatomy. CONTRAST:  111mL OMNIPAQUE IOHEXOL 350 MG/ML SOLN COMPARISON:  X-ray 07/29/2019 FINDINGS: Cardiovascular: Prominent thrombus within the lobar, segmental, and subsegmental branches of the right lower lobe (series 6, image 68). Additional thrombi within segmental and subsegmental branch pulmonary arteries of the left lower lobe (series 6, image 74). Segmental branch right upper lobe filling defect (series 6, image 57). No left upper lobe pulmonary arterial filling defects. No saddle embolism. RV to LV ratio of 1.0 with flattening of the interventricular septum. Heart size within normal limits. No pericardial effusion. Thoracic aorta is nonaneurysmal. Scattered coronary artery calcification. Mediastinum/Nodes: No enlarged mediastinal, hilar, or axillary lymph nodes. Thyroid gland, trachea, and esophagus demonstrate no significant findings. Lungs/Pleura: Patchy consolidation and ground-glass opacity within the right lower lobe. Extensive bibasilar atelectasis. No pleural effusion or pneumothorax. Upper Abdomen: No acute findings. Musculoskeletal: No chest wall abnormality. No acute or significant osseous findings. Review of the MIP images confirms the above findings. IMPRESSION: 1. Extensive bilateral lobar, segmental, and subsegmental pulmonary emboli within the bilateral lower lobe pulmonary arteries, right worse than left. There is also involvement of a segmental branch artery of the right upper lobe. 2. RV to LV ratio of 1.0, which can be seen with right heart strain. 3. Patchy consolidation and ground-glass opacity within the right lower lobe may represent pneumonia versus pulmonary infarction. 4. Extensive bibasilar atelectasis. These results were called by telephone at the time of interpretation on 07/29/2019 at 12:58 pm to provider Lacretia Leigh , who verbally  acknowledged these results. Electronically Signed   By: Davina Poke D.O.   On: 07/29/2019 13:00    Stark Klein, MD 07/30/2019, 9:37 AM PGY-1, [place] Intern pager: [phone], text pages welcome

## 2019-07-30 NOTE — Progress Notes (Signed)
ANTICOAGULATION CONSULT NOTE  Pharmacy Consult for Heparin Indication: pulmonary embolus  No Known Allergies  Patient Measurements: Height: 6\' 2"  (188 cm) Weight: 102.1 kg (225 lb) IBW/kg (Calculated) : 82.2 Heparin Dosing Weight: 102.1 kg  Vital Signs: Temp: 98.4 F (36.9 C) (05/05 2336) Temp Source: Oral (05/05 2336) BP: 127/84 (05/05 2336) Pulse Rate: 88 (05/05 2300)  Labs: Recent Labs    07/29/19 0858 07/29/19 1140 07/29/19 2142 07/30/19 0319  HGB 14.2  --   --  12.4*  HCT 45.6  --   --  40.0  PLT 239  --   --  210  APTT  --   --   --  84*  LABPROT  --   --   --  14.5  INR  --   --   --  1.2  HEPARINUNFRC  --   --  0.46 0.35  CREATININE 1.07  --   --  1.00  TROPONINIHS 14 14  --   --     Estimated Creatinine Clearance: 97.7 mL/min (by C-G formula based on SCr of 1 mg/dL).   Medical History: Past Medical History:  Diagnosis Date  . Abnormal heart rhythm    "fibrillation of heart", diagnosed 4 years ago    Medications:  Scheduled:  . lidocaine  1 patch Transdermal Q24H  . nicotine  7 mg Transdermal Daily    Assessment: Patient is a 59 yom that presented to the ED with SOB. He has a Hx of PE in the past and was suppose to be on Warfarin for this however has not been complaint. Patient was found to have extensive PEs with suggestion of right heart strain. Pharmacy has been asked to dose heparin.   HL remains therapeutic on drip rate 1600 units/hr. Hgb dropped 14.2 to 12.4 since yesterday. Plts wnl. No overt bleeding or infusion issues noted. INR 1.2.   Goal of Therapy:  Heparin level 0.3-0.7 units/ml Monitor platelets by anticoagulation protocol: Yes   Plan:  Continue heparin infusion at 1600 units/hr Monitor daily HL, CBC, s/sx bleeding  70, PharmD PGY1 Pharmacy Resident Phone: [phone] 07/30/2019  6:25 AM  Please check [email or web address] for unit-specific pharmacy phone numbers.

## 2019-07-30 NOTE — Progress Notes (Signed)
OT Cancellation Note  Patient Details Name: Patrick Warner MRN: [id number] DOB: [birth year]   Cancelled Treatment:    Reason Eval/Treat Not Completed: Medical issues which prohibited therapy. Pt is a 64 y.o. male presenting with SOB, found to have extensive bilateral PE's with the suggestion of right heart strain. Heparin infusion started 07/29/19 at 1518. Per mobility protocol for new diagnosis of PE, hold therapy until 24 hours after anticoagulation. OT will follow up tomorrow.   Peterson Ao 07/30/2019, 9:05 AM

## 2019-07-30 NOTE — Evaluation (Signed)
Physical Therapy Evaluation Patient Details Name: Patrick Warner MRN: [id number] DOB: [birth year] Today's Date: 07/30/2019   History of Present Illness  64 yo male admitted to ED on 5/4 for ShOB, pt with bilateral PEs and heart strain secondary to PEs. PMH includes DVT, afib, smoker.  Clinical Impression   Pt presents with dyspnea on exertion, decreased activity tolerance vs baseline, chronic L hip and knee weakness and limited ROM, and mild unsteadiness in standing. Pt to benefit from acute PT to address deficits. Pt ambulated hallway distance with close guard from PT, pt's main issue during mobility being his O2 desaturation to 81% on RA and tachycardia to 115 bpm, requiring 2LO2, standing rest, and breathing technique to recover. No accompanying chest pain during mobility, but pt very tachypneic and diaphoretic with exertion and work of breathing. Mobility-wise, pt appears to be close to baseline and will have family to assist at home as needed. No PT follow-up currently needed. PT to progress mobility as tolerated, and will continue to follow acutely.   SATURATION QUALIFICATIONS: (This note is used to comply with regulatory documentation for home oxygen)  Patient Saturations on Room Air at Rest = 96%  Patient Saturations on Room Air while Ambulating = 81%  Patient Saturations on 2 Liters of oxygen while Ambulating = 98%  Please briefly explain why patient needs home oxygen: to maintain SpO2 >90%    Follow Up Recommendations No PT follow up;Supervision for mobility/OOB    Equipment Recommendations  None recommended by PT    Recommendations for Other Services       Precautions / Restrictions Precautions Precautions: Other (comment) Precaution Comments: sats, dyspnea on exertion Restrictions Weight Bearing Restrictions: No      Mobility  Bed Mobility Overal bed mobility: Needs Assistance Bed Mobility: Supine to Sit     Supine to sit: HOB elevated;Supervision      General bed mobility comments: supervision for safety, increased time and effort.  Transfers Overall transfer level: Needs assistance Equipment used: None Transfers: Sit to/from Stand Sit to Stand: Min guard         General transfer comment: min guard for safety, increased time to rise and steady with reaching for environment when standing.  Ambulation/Gait Ambulation/Gait assistance: Min guard Gait Distance (Feet): 220 Feet Assistive device: None Gait Pattern/deviations: Step-through pattern;Decreased stride length;Trunk flexed;Trendelenburg Gait velocity: decr   General Gait Details: for safety, + L trendelenburg gait due to L hip and knee weakness. Verbal cuing for upright posture. Pt with DOE 3/4, RR 30s, SpO2 81% on RA requiring 2LO2 to recover sats >90%.  Stairs            Wheelchair Mobility    Modified Rankin (Stroke Patients Only)       Balance Overall balance assessment: Mild deficits observed, not formally tested Sitting-balance support: No upper extremity supported Sitting balance-Leahy Scale: Good     Standing balance support: No upper extremity supported;During functional activity Standing balance-Leahy Scale: Fair                               Pertinent Vitals/Pain Pain Assessment: Faces Faces Pain Scale: Hurts a little bit Pain Location: chest Pain Descriptors / Indicators: Sore Pain Intervention(s): Limited activity within patient's tolerance;Monitored during session;Repositioned    Home Living Family/patient expects to be discharged to:: Private residence Living Arrangements: Children(daughter and granddaughter) Available Help at Discharge: Family;Available PRN/intermittently Type of Home: House Home Access: Stairs to  enter Entrance Stairs-Rails: Right;Left Entrance Stairs-Number of Steps: 5 Home Layout: One level Home Equipment: None      Prior Function Level of Independence: Independent               Hand  Dominance   Dominant Hand: Right    Extremity/Trunk Assessment   Upper Extremity Assessment Upper Extremity Assessment: Defer to OT evaluation    Lower Extremity Assessment Lower Extremity Assessment: LLE deficits/detail;Overall WFL for tasks assessed LLE Deficits / Details: + trendelenburg, hip and knee dysfunction    Cervical / Trunk Assessment Cervical / Trunk Assessment: Normal  Communication   Communication: No difficulties  Cognition Arousal/Alertness: Awake/alert Behavior During Therapy: WFL for tasks assessed/performed Overall Cognitive Status: Within Functional Limits for tasks assessed                                 General Comments: Pt very pleasant and polite, motivated to participate in OOB mobility.      General Comments      Exercises     Assessment/Plan    PT Assessment Patient needs continued PT services  PT Problem List Decreased strength;Decreased mobility;Decreased activity tolerance;Cardiopulmonary status limiting activity       PT Treatment Interventions Therapeutic activities;Gait training;Therapeutic exercise;Patient/family education;Balance training;Functional mobility training;Neuromuscular re-education    PT Goals (Current goals can be found in the Care Plan section)  Acute Rehab PT Goals Patient Stated Goal: go home, breathe better PT Goal Formulation: With patient Time For Goal Achievement: 08/13/19 Potential to Achieve Goals: Good    Frequency Min 3X/week   Barriers to discharge        Co-evaluation               AM-PAC PT "6 Clicks" Mobility  Outcome Measure Help needed turning from your back to your side while in a flat bed without using bedrails?: None Help needed moving from lying on your back to sitting on the side of a flat bed without using bedrails?: None Help needed moving to and from a bed to a chair (including a wheelchair)?: None Help needed standing up from a chair using your arms (e.g.,  wheelchair or bedside chair)?: None Help needed to walk in hospital room?: A Little Help needed climbing 3-5 steps with a railing? : A Lot 6 Click Score: 21    End of Session Equipment Utilized During Treatment: Gait belt;Oxygen Activity Tolerance: Patient limited by fatigue Patient left: with call bell/phone within reach;in chair Nurse Communication: Mobility status PT Visit Diagnosis: Other abnormalities of gait and mobility (R26.89)    Time: 3419-3790 PT Time Calculation (min) (ACUTE ONLY): 30 min   Charges:   PT Evaluation $PT Eval Low Complexity: 1 Low PT Treatments $Gait Training: 8-22 mins       Mancel Lardizabal E, PT Acute Rehabilitation Services Pager [phone]  Office [phone]  Verenis Nicosia D Elonda Husky 07/30/2019, 4:38 PM

## 2019-07-30 NOTE — Progress Notes (Signed)
  Echocardiogram 2D Echocardiogram has been performed.  Leta Jungling M 07/30/2019, 11:00 AM

## 2019-07-30 NOTE — Discharge Instructions (Addendum)
Dear Patrick Warner,   Thank you so much for allowing [place] to be part of your care!  You were admitted to [facility] for pulmonary emboli or blood clots in your lungs. You were treated with blood thinner medications and oxygen to help your breathing. You will be discharged with supplemental oxygen as we noticed you had lower oxygen numbers while walking. Please continue to use this oxygen when you need it. Please continue to take your blood thinner, Eliquis, two times per day every day. This is important to help prevent further blood clots.    POST-HOSPITAL & CARE INSTRUCTIONS 1. It is important that you continue to take the Eliquis on daily basis twice daily in order to prevent further blood clots.  2. We coordinated with social work to get you established with the community health and wellness center for primary care and management of your medications. Please follow up with them after discharge.  3. Please see medications section of this packet for any medication changes.   DOCTOR'S APPOINTMENT & FOLLOW UP CARE INSTRUCTIONS  Future Appointments  Date Time Provider Department Center  08/06/2019 11:00 AM Storm Frisk, MD [facility] None    RETURN PRECAUTIONS: Please return to care if you experience worsening shortness of breath or chest pain or become dizzy with walking or standing.   Take care and be well!  Family Medicine Teaching Service Inpatient Team [place]  [facility]  [address] [place], [place] [address] [phone]       Information on my medicine - ELIQUIS (apixaban)  Why was Eliquis prescribed for you? Eliquis was prescribed to treat blood clots that may have been found in the veins of your legs (deep vein thrombosis) or in your lungs (pulmonary embolism) and to reduce the risk of them occurring again.  What do You need to know about Eliquis ? The starting dose is 10 mg (two 5 mg tablets) taken TWICE daily for the FIRST  SEVEN (7) DAYS, then on 08/06/19  the dose is reduced to ONE 5 mg tablet taken TWICE daily.  Eliquis may be taken with or without food.   Try to take the dose about the same time in the morning and in the evening. If you have difficulty swallowing the tablet whole please discuss with your pharmacist how to take the medication safely.  Take Eliquis exactly as prescribed and DO NOT stop taking Eliquis without talking to the doctor who prescribed the medication.  Stopping may increase your risk of developing a new blood clot.  Refill your prescription before you run out.  After discharge, you should have regular check-up appointments with your healthcare provider that is prescribing your Eliquis.    What do you do if you miss a dose? If a dose of ELIQUIS is not taken at the scheduled time, take it as soon as possible on the same day and twice-daily administration should be resumed. The dose should not be doubled to make up for a missed dose.  Important Safety Information A possible side effect of Eliquis is bleeding. You should call your healthcare provider right away if you experience any of the following: ? Bleeding from an injury or your nose that does not stop. ? Unusual colored urine (red or dark brown) or unusual colored stools (red or black). ? Unusual bruising for unknown reasons. ? A serious fall or if you hit your head (even if there is no bleeding).  Some medicines may  interact with Eliquis and might increase your risk of bleeding or clotting while on Eliquis. To help avoid this, consult your healthcare provider or pharmacist prior to using any new prescription or non-prescription medications, including herbals, vitamins, non-steroidal anti-inflammatory drugs (NSAIDs) and supplements.  This website has more information on Eliquis (apixaban): http://www.eliquis.com/eliquis/home

## 2019-07-31 DIAGNOSIS — I2699 Other pulmonary embolism without acute cor pulmonale: Secondary | ICD-10-CM

## 2019-07-31 LAB — CBC WITH DIFFERENTIAL/PLATELET
Abs Immature Granulocytes: 0.02 10*3/uL (ref 0.00–0.07)
Basophils Absolute: 0 10*3/uL (ref 0.0–0.1)
Basophils Relative: 0 %
Eosinophils Absolute: 0 10*3/uL (ref 0.0–0.5)
Eosinophils Relative: 0 %
HCT: 39 % (ref 39.0–52.0)
Hemoglobin: 12.3 g/dL — ABNORMAL LOW (ref 13.0–17.0)
Immature Granulocytes: 0 %
Lymphocytes Relative: 18 %
Lymphs Abs: 1.4 10*3/uL (ref 0.7–4.0)
MCH: 28.1 pg (ref 26.0–34.0)
MCHC: 31.5 g/dL (ref 30.0–36.0)
MCV: 89 fL (ref 80.0–100.0)
Monocytes Absolute: 1 10*3/uL (ref 0.1–1.0)
Monocytes Relative: 12 %
Neutro Abs: 5.5 10*3/uL (ref 1.7–7.7)
Neutrophils Relative %: 70 %
Platelets: 261 10*3/uL (ref 150–400)
RBC: 4.38 MIL/uL (ref 4.22–5.81)
RDW: 15.7 % — ABNORMAL HIGH (ref 11.5–15.5)
WBC: 8 10*3/uL (ref 4.0–10.5)
nRBC: 0 % (ref 0.0–0.2)

## 2019-07-31 LAB — COMPREHENSIVE METABOLIC PANEL
ALT: 52 U/L — ABNORMAL HIGH (ref 0–44)
AST: 54 U/L — ABNORMAL HIGH (ref 15–41)
Albumin: 2.3 g/dL — ABNORMAL LOW (ref 3.5–5.0)
Alkaline Phosphatase: 90 U/L (ref 38–126)
Anion gap: 8 (ref 5–15)
BUN: 8 mg/dL (ref 8–23)
CO2: 24 mmol/L (ref 22–32)
Calcium: 9.2 mg/dL (ref 8.9–10.3)
Chloride: 104 mmol/L (ref 98–111)
Creatinine, Ser: 1 mg/dL (ref 0.61–1.24)
GFR calc Af Amer: >60 mL/min (ref >60)
GFR calc non Af Amer: >60 mL/min (ref >60)
Glucose, Bld: 93 mg/dL (ref 70–99)
Potassium: 3.5 mmol/L (ref 3.5–5.1)
Sodium: 136 mmol/L (ref 135–145)
Total Bilirubin: 1 mg/dL (ref 0.3–1.2)
Total Protein: 7.1 g/dL (ref 6.5–8.1)

## 2019-07-31 MED ORDER — APIXABAN (ELIQUIS) VTE STARTER PACK (10MG AND 5MG)
ORAL_TABLET | ORAL | 0 refills | Status: DC
Start: 2019-07-31 — End: 2019-09-24

## 2019-07-31 MED ORDER — APIXABAN 5 MG PO TABS: 5.0000 mg | ORAL_TABLET | Freq: Two times a day (BID) | ORAL | 0 refills | Status: DC

## 2019-07-31 MED ORDER — APIXABAN 5 MG PO TABS: 10.0000 mg | ORAL_TABLET | Freq: Two times a day (BID) | ORAL | 0 refills | Status: DC

## 2019-07-31 MED FILL — ELIQUIS STARTER PACK 5 MG T: 5 | 30 days supply | Qty: 74 | Fill #0

## 2019-07-31 NOTE — Evaluation (Signed)
Occupational Therapy Evaluation and Discharge Patient Details Name: Patrick Warner MRN: [id number] DOB: Mar 17, 1956 Today's Date: 07/31/2019    History of Present Illness 64 yo male admitted to ED on 5/4 for ShOB, pt with bilateral PEs and heart strain secondary to PEs. PMH includes DVT, afib, smoker.   Clinical Impression   Pt is functioning at a modified independent level. All education completed. Pt is eager to go home.    Follow Up Recommendations  No OT follow up    Equipment Recommendations  Tub/shower seat    Recommendations for Other Services       Precautions / Restrictions Precautions Precaution Comments: monitor sats Restrictions Weight Bearing Restrictions: No      Mobility Bed Mobility Overal bed mobility: Modified Independent             General bed mobility comments: HOB up  Transfers Overall transfer level: Modified independent               General transfer comment: slow to rise, but no LOB or physical assist, pt has been taking himself to the bathroom routinely    Balance Overall balance assessment: Mild deficits observed, not formally tested   Sitting balance-Leahy Scale: Normal       Standing balance-Leahy Scale: Fair                             ADL either performed or assessed with clinical judgement   ADL Overall ADL's : Modified independent                                       General ADL Comments: educated and provided long handled bath sponge and sock aide, instructed to dress L LE first, educated in energy conservation and provided handout     Vision Patient Visual Report: No change from baseline       Perception     Praxis      Pertinent Vitals/Pain Pain Assessment: No/denies pain     Hand Dominance Right   Extremity/Trunk Assessment Upper Extremity Assessment Upper Extremity Assessment: Overall WFL for tasks assessed   Lower Extremity Assessment Lower Extremity  Assessment: Defer to PT evaluation   Cervical / Trunk Assessment Cervical / Trunk Assessment: Normal   Communication Communication Communication: No difficulties   Cognition Arousal/Alertness: Awake/alert Behavior During Therapy: WFL for tasks assessed/performed Overall Cognitive Status: Within Functional Limits for tasks assessed                                     General Comments       Exercises     Shoulder Instructions      Home Living Family/patient expects to be discharged to:: Private residence Living Arrangements: Children Available Help at Discharge: Family;Available PRN/intermittently Type of Home: House Home Access: Stairs to enter [employer] of Steps: 5 Entrance Stairs-Rails: Right;Left Home Layout: One level     Bathroom Shower/Tub: Chief Strategy Officer: Standard     Home Equipment: None   Additional Comments: recommended shower seat      Prior Functioning/Environment Level of Independence: Independent                 OT Problem List:        OT Treatment/Interventions:  OT Goals(Current goals can be found in the care plan section) Acute Rehab OT Goals Patient Stated Goal: go home, breathe better OT Goal Formulation: With patient  OT Frequency:     Barriers to D/C:            Co-evaluation              AM-PAC OT "6 Clicks" Daily Activity     Outcome Measure Help from another person eating meals?: None Help from another person taking care of personal grooming?: None Help from another person toileting, which includes using toliet, bedpan, or urinal?: None Help from another person bathing (including washing, rinsing, drying)?: None Help from another person to put on and taking off regular upper body clothing?: None Help from another person to put on and taking off regular lower body clothing?: None 6 Click Score: 24   End of Session    Activity Tolerance: Patient tolerated  treatment well Patient left: in bed;with call bell/phone within reach  OT Visit Diagnosis: Other abnormalities of gait and mobility (R26.89)                Time: 2542-7062 OT Time Calculation (min): 21 min Charges:  OT General Charges $OT Visit: 1 Visit OT Evaluation $OT Eval Moderate Complexity: 1 Mod  Nestor Lewandowsky, OTR/L Acute Rehabilitation Services Pager: [phone] Office: [phone]  Malka So 07/31/2019, 10:13 AM

## 2019-07-31 NOTE — Progress Notes (Signed)
Patient noted to not need any oxygen on ambulation.  Patient ready for discharge without oxygen.

## 2019-07-31 NOTE — Progress Notes (Addendum)
Family Medicine Teaching Service Daily Progress Note Intern Pager: [phone]  Patient name: Patrick Warner Medical record number: [id number] Date of birth: 06-18-1955 Age: 64 y.o. Gender: male  Primary Care Provider: Patient, No Pcp Per Consultants: Pharmacy, heparin Code Status: Full code  Pt Overview and Major Events to Date:  5/5: CTA confirmed pulmonary embolus, patient started on heparin drip  Assessment and Plan: Patrick Warner is a 64 y.o. male presenting with SOB, found to have PE. PMH is significant for afib? and LLE DVT.  Dyspnea 2/2 Submassive Bilateral Pulmonary Emboli Patient reports breathing continues to improve.  He does report some shortness of breath with ambulating that he did not experience prior to his symptom onset prior to admission.  Patient ambulated with physical therapy and noted to desat to 81% with ambulation and had 98% oxygen saturation with 2 L oxygen.  Patient also reporting intermittent lower thoracic pains with deep inspiration.  Given known pulmonary findings, this is likely pleuritic chest pain, however will check EKG. -Twelve-lead EKG today,no changes since admission  -transitioned to Eliquis BID -ambulate with pulse ox again  -Patient will discharge with home oxygen  Questionable hx of Atrial fibrillation Patient reports history of atrial fibrillation.  Did not have prior EKGs to compare. Patient is not noted to be in atrial fibrillation on admission. -Cardiac monitoring -Continue to monitor heart rate with vitals  Mild Transaminitis  Patient's AST and ALT mildly elevated at 54 and 52 respectively. AST improved from 59, however, ALT uptrended from 47. Patient reports drinking four beers/weekend day. Elevation is not significant enough to consider hepatitis or hepatic failure. No signs of portal hypertension. No RUQ pain on exam or per patient report. Alk Phos within normal limits.  -recommend outpatient follow up of CMP and monitoring    History of left lower extremity DVT?  LUE pain, resolved    Patient reports no pain today.  -monitor LUE pain   Tobacco use disorder Pt smokes approximately 2/3rds of a pack per day. Contributing factor to his PE.  - encourage smoking cessation - nicotine replacement therapy  FEN/GI: regular diet   PPx: on Eliquis  Disposition: anticipate discharge today 5/7   Subjective:  Patient states that his breathing continues to improve, reports no shortness of breath with ambulation but is better with oxygen via nasal cannula.  Patient reports occasional sharp thoracic pains that radiate from right side of his chest to the left side of his chest that occur with deep respiration.  Patient emphasizes that that will happen often but happens sometimes throughout the day since he has been diagnosed with PE.  Objective: Temp:  [98 F (36.7 C)-99 F (37.2 C)] 98.9 F (37.2 C) (05/07 0843) Pulse Rate:  [73-95] 88 (05/07 0843) Resp:  [17-20] 19 (05/07 0843) BP: (116-127)/(74-91) 118/81 (05/07 0843) SpO2:  [94 %-100 %] 94 % (05/07 0843)  Physical Exam: General: male appearing stated age in no acute distress lying in bed watching television HEENT: MMM, some missing teeth Cardio: Normal S1 and S2, no S3 or S4. Rhythm is regular. No murmurs or rubs.  Bilateral radial pulses palpable Pulm: Clear to auscultation bilaterally, no crackles nor wheezing, improved yet still diminished breath sounds on the right lung field base. Normal respiratory effort, stable on area at rest Abdomen: Bowel sounds normal. Abdomen soft and non-tender.  Extremities: No peripheral edema. Warm/ well perfused Neuro: pt alert and oriented x4   Laboratory: Recent Labs  Lab 07/29/19 0858 07/30/19 0319 07/31/19  0337  WBC 11.4* 10.7* 8.0  HGB 14.2 12.4* 12.3*  HCT 45.6 40.0 39.0  PLT 239 210 261   Recent Labs  Lab 07/29/19 0858 07/30/19 0319 07/31/19 0337  NA 137 137 136  K 3.6 3.8 3.5  CL 100 104 104  CO2 24  23 24   BUN 6* 7* 8  CREATININE 1.07 1.00 1.00  CALCIUM 10.1 9.3 9.2  PROT  --  7.1 7.1  BILITOT  --  0.7 1.0  ALKPHOS  --  86 90  ALT  --  47* 52*  AST  --  59* 54*  GLUCOSE 120* 100* 93    No results found.  Stark Klein, MD 07/31/2019, 11:10 AM PGY-1, [place] Intern pager: [phone], text pages welcome

## 2019-07-31 NOTE — Progress Notes (Signed)
Physical Therapy Treatment Patient Details Name: Patrick Warner MRN: [id number] DOB: 01-Sep-1955 Today's Date: 07/31/2019    History of Present Illness 63 yo male admitted to ED on 5/4 for ShOB, pt with bilateral PEs and heart strain secondary to PEs. PMH includes DVT, afib, smoker.    PT Comments    Pt with much improved tolerance for OOB activity this day, ambulated >300 ft with SpO2 maitained at 96% and greater on RA. Pt with slow and steady gait, limited by chronic L hip dysfunction. PT educated pt on the importance of rest breaks, having pt take rest breaks with increased work of breathing and when pt appeared fatigued. PT also educated pt on the importance of energy conservation techniques upon d/c home, pt expresses understanding and is eager to d/c home.   Follow Up Recommendations  No PT follow up;Supervision for mobility/OOB     Equipment Recommendations  None recommended by PT    Recommendations for Other Services       Precautions / Restrictions Precautions Precautions: Other (comment) Precaution Comments: sats, dyspnea on exertion Restrictions Weight Bearing Restrictions: No    Mobility  Bed Mobility               General bed mobility comments: up in chair upon PT arrival to room  Transfers Overall transfer level: Modified independent Equipment used: None Transfers: Sit to/from Stand           General transfer comment: Mod I for increased time and effort, no physical assist required.  Ambulation/Gait Ambulation/Gait assistance: Supervision Gait Distance (Feet): 300 Feet Assistive device: None Gait Pattern/deviations: Step-through pattern;Decreased stride length;Trunk flexed;Trendelenburg Gait velocity: decr   General Gait Details: supervision for safety, slow and limping due to chronic L hip dysfunction but improves when pt "loosens up" during gait. DOE 2/4, SpO2 96% and greater on RA. HRmax 114 bpm.   Stairs              Wheelchair Mobility    Modified Rankin (Stroke Patients Only)       Balance Overall balance assessment: Mild deficits observed, not formally tested Sitting-balance support: No upper extremity supported Sitting balance-Leahy Scale: Normal     Standing balance support: During functional activity Standing balance-Leahy Scale: Fair Standing balance comment: no external assist required, slow and limping due to L hip dysfunction                            Cognition Arousal/Alertness: Awake/alert Behavior During Therapy: WFL for tasks assessed/performed Overall Cognitive Status: Within Functional Limits for tasks assessed                                        Exercises Other Exercises Other Exercises: Pt education: energy conservation strategies, rest breaks as needed at home    General Comments        Pertinent Vitals/Pain Pain Assessment: Faces Faces Pain Scale: Hurts a little bit Pain Location: L lower ribs, from work of breathing Pain Descriptors / Indicators: Discomfort Pain Intervention(s): Monitored during session;Limited activity within patient's tolerance    Home Living                      Prior Function            PT Goals (current goals can now be found in the care  plan section) Acute Rehab PT Goals Patient Stated Goal: go home, breathe better PT Goal Formulation: With patient Time For Goal Achievement: 08/13/19 Potential to Achieve Goals: Good Progress towards PT goals: Progressing toward goals    Frequency    Min 3X/week      PT Plan Current plan remains appropriate    Co-evaluation              AM-PAC PT "6 Clicks" Mobility   Outcome Measure  Help needed turning from your back to your side while in a flat bed without using bedrails?: None Help needed moving from lying on your back to sitting on the side of a flat bed without using bedrails?: None Help needed moving to and from a bed to a  chair (including a wheelchair)?: None Help needed standing up from a chair using your arms (e.g., wheelchair or bedside chair)?: None Help needed to walk in hospital room?: A Little Help needed climbing 3-5 steps with a railing? : A Little 6 Click Score: 22    End of Session   Activity Tolerance: Patient tolerated treatment well Patient left: with call bell/phone within reach;in chair Nurse Communication: Mobility status PT Visit Diagnosis: Other abnormalities of gait and mobility (R26.89)     Time: 9935-7017 PT Time Calculation (min) (ACUTE ONLY): 18 min  Charges:  $Self Care/Home Management: 8-22                     Richrd Sox, PT Acute Rehabilitation Services Pager [phone]  Office [phone]    Patrick Warner 07/31/2019, 2:30 PM

## 2019-07-31 NOTE — Plan of Care (Signed)
Patient verbalizes understanding of treatment plan and diagnosis

## 2019-07-31 NOTE — Plan of Care (Signed)
Patient is adequate for discaharge.

## 2019-07-31 NOTE — TOC Initial Note (Signed)
Transition of Care [facility]) - Initial/Assessment Note    Patient Details  Name: Patrick Warner MRN: [id number] Date of Birth: 04-Nov-1955  [facility] CM/SW Contact:    Beckie Busing, RN Phone Number: [phone]  07/31/2019, 9:49 AM  Clinical Narrative:   CM at bedside to assess patients ability to obtain meds and follow up with MD post discharge. Patient states that he has no insurance and no PCP. MATCH form has been completed and patient has been provided with Eliquis 30 day card. Appointment has been set up with [facility]. Information will be added to AVS and patient will be updated. Will continue to follow for any further needs.              Expected Discharge Plan: Home/Self Care Barriers to Discharge: Continued Medical Work up   Patient Goals and CMS Choice Patient states their goals for this hospitalization and ongoing recovery are:: To go home   Choice offered to / list presented to : NA  Expected Discharge Plan and Services Expected Discharge Plan: Home/Self Care In-house Referral: NA Discharge Planning Services: CM Consult Post Acute Care Choice: NA Living arrangements for the past 2 months: Single Family Home                 DME Arranged: N/A DME Agency: NA       HH Arranged: NA HH Agency: NA        Prior Living Arrangements/Services Living arrangements for the past 2 months: Single Family Home Lives with:: Adult Children Patient language and need for interpreter reviewed:: Yes Do you feel safe going back to the place where you live?: Yes      Need for Family Participation in Patient Care: No (Comment) Care giver support system in place?: Yes (comment) Current home services: Other (comment)(none) Criminal Activity/Legal Involvement Pertinent to Current Situation/Hospitalization: No - Comment as needed  Activities of Daily Living Home Assistive Devices/Equipment: None ADL Screening (condition at time of  admission) Patient's cognitive ability adequate to safely complete daily activities?: Yes Is the patient deaf or have difficulty hearing?: No Does the patient have difficulty seeing, even when wearing glasses/contacts?: No Does the patient have difficulty concentrating, remembering, or making decisions?: No Patient able to express need for assistance with ADLs?: Yes Does the patient have difficulty dressing or bathing?: No Independently performs ADLs?: Yes (appropriate for developmental age) Does the patient have difficulty walking or climbing stairs?: Yes(sob and weakness) Weakness of Legs: None Weakness of Arms/Hands: None  Permission Sought/Granted Permission sought to share information with : Family Supports Permission granted to share information with : Yes, Verbal Permission Granted  Share Information with NAME: Patrick Warner     Permission granted to share info w Relationship: daughter     Emotional Assessment Appearance:: Appears stated age Attitude/Demeanor/Rapport: Gracious Affect (typically observed): Accepting, Calm, Pleasant Orientation: : Oriented to Self, Oriented to Place, Oriented to  Time, Oriented to Situation Alcohol / Substance Use: Not Applicable Psych Involvement: No (comment)  Admission diagnosis:  Pulmonary embolism (HCC) [I26.99] Dyspnea, unspecified type [R06.00] Patient Active Problem List   Diagnosis Date Noted  . Pulmonary embolism (HCC) 07/29/2019  . Dyspnea   . Hx of atrial fibrillation without current medication   . Community acquired pneumonia of right lower lobe of lung    PCP:  Patient, No Pcp Per Pharmacy:   [id number] - [place], [place] - [address] [place]  [place] [address] Phone: [phone] Fax: [phone]  [place] [place] [address] [place] [address] Phone: [phone] Fax: [phone]     Social  Determinants of Health (SDOH) Interventions    Readmission Risk Interventions No flowsheet data found.

## 2019-07-31 NOTE — Progress Notes (Signed)
SATURATION QUALIFICATIONS: (This note is used to comply with regulatory documentation for home oxygen)  Patient Saturations on Room Air at Rest = 98%  Patient Saturations on Room Air while Ambulating = 96%  Patient Saturations on -- Liters of oxygen while Ambulating = -- %  Please briefly explain why patient needs home oxygen: pt maintained sats on RA during mobility, does not require supplemental O2 at this time.   Richrd Sox, PT Acute Rehabilitation Services Pager [phone]  Office [phone]

## 2019-07-31 NOTE — Discharge Summary (Addendum)
[facility] Discharge Summary  Patient name: Patrick Warner record number: [id number] Date of birth: 1955-09-15 Age: 64 y.o. Gender: male Date of Admission: 07/29/2019  Date of Discharge: 07/31/19 Admitting Physician: Stark Klein, MD  Primary Care Provider: Patient, No Pcp Per Consultants: [facility]  Indication for Hospitalization: PE   Discharge Diagnoses/Problem List:   Active Problems:   Pulmonary embolism [facility])  Disposition: discharge home   Discharge Condition: stable and improved  Discharge Exam:  General: male appearing stated age in no acute distress lying in bed watching television HEENT: MMM, some missing teeth Cardio: Normal S1 and S2, no S3 or S4. Rhythm is regular. No murmurs or rubs.  Bilateral radial pulses palpable Pulm: Clear to auscultation bilaterally, no crackles nor wheezing, improved yet still diminished breath sounds on the right lung field base. Normal respiratory effort, stable on area at rest Abdomen: Bowel sounds normal. Abdomen soft and non-tender.  Extremities: No peripheral edema. Warm/ well perfused Neuro: pt alert and oriented x4     Brief Hospital Course:   Patrick Warner is a 64 y.o. male with possible hx of atrial fibrillation who presented to the ED with SOB for five days found to have bilateral pulmonary emboli.   Submassive Pulmonary Emboli Patient presented to the ED with SOB, was treated with supplental oxygen and underwent CXR with concern for CAP. He was treated with azithro and ctx for one time dosing but given that he was afebrile and without leukocytosis and CTA suggested pulmonary findings were potentially due to infarction from emboli, these antibiotics were not continued as patient was thought to not have pneumonia. Patient did report new hemoptysis in addition to his chronic smoker's cough. EKG on admission showed right bundle branch block and no atrial fibrillation nor signs of ischemia  with troponin levels that were unremarkable at 14 and 14. CTA was completed and showed Extensive bilateral lobar, segmental, and subsegmental pulmonary emboli within the bilateral lower lobe pulmonary arteries with involvement of a segmental branchartery of the right upper lobe. CTA had RV to LV ratio of 1.0, usually seen with right heart strain as well as patchy consolidation and ground-glass opacity within the right lower lobe may have represented pneumonia versus pulmonary infarction.The patient was started on heparin drip and on 5/6, was transitioned to Eliquis. Patient had no PCP on admission so TOC was conuslted to assist in getting the patient [facility]. Patient was scheduled for follow up at the time of discharge. Echocardiogram was completed and showed LVEF 60-65% with some aortic root dilation. Patient was also evaluated by physical therapy while admitted with no follow up recommendations. He did not require supplemental oxygen upon discharge.    Issues for Follow Up:  Please complete CMP and monitor AST and ALT as they were mildly elevated during this admission.  Please confirm patient has been adherent with taking Eliquis twice daily for Pulmonary emboli with emphasis on continuing this medication to prevent further clots.  Continue to encourage smoking cessation and decreasing his weekend alcohol consumption. He reported 4 beers per day on weekends.   Significant Procedures: None  Significant Labs and Imaging:  Recent Labs  Lab 07/29/19 0858 07/30/19 0319 07/31/19 0337  WBC 11.4* 10.7* 8.0  HGB 14.2 12.4* 12.3*  HCT 45.6 40.0 39.0  PLT 239 210 261   Recent Labs  Lab 07/29/19 0858 07/29/19 0858 07/30/19 0319 07/31/19 0337  NA 137  --  137  136  K 3.6   < > 3.8 3.5  CL 100  --  104 104  CO2 24  --  23 24  GLUCOSE 120*  --  100* 93  BUN 6*  --  7* 8  CREATININE 1.07  --  1.00 1.00  CALCIUM 10.1  --  9.3 9.2  ALKPHOS  --   --   86 90  AST  --   --  59* 54*  ALT  --   --  47* 52*  ALBUMIN  --   --  2.5* 2.3*   < > = values in this interval not displayed.    DG Chest 2 View  Result Date: 07/29/2019 CLINICAL DATA:  Shortness of breath EXAM: CHEST - 2 VIEW COMPARISON:  None. FINDINGS: There is atelectatic change bilaterally with small left pleural effusion. There is ill-defined opacity in the right mid and lower lung regions. Heart size and pulmonary vascularity are normal. No adenopathy. There is degenerative change in the thoracic spine. IMPRESSION: Ill-defined airspace opacity in the right mid and lower lung regions consistent pneumonia involving several lung segments. Bibasilar atelectasis with small left pleural effusion. Cardiac silhouette normal.  No adenopathy. These findings may warrant correlation with COVID-19 status. Electronically Signed   By: Bretta Bang III M.D.   On: 07/29/2019 09:36   CT Angio Chest PE W/Cm &/Or Wo Cm  Result Date: 07/29/2019 CLINICAL DATA:  Shortness of breath EXAM: CT ANGIOGRAPHY CHEST WITH CONTRAST TECHNIQUE: Multidetector CT imaging of the chest was performed using the standard protocol during bolus administration of intravenous contrast. Multiplanar CT image reconstructions and MIPs were obtained to evaluate the vascular anatomy. CONTRAST:  OMNIPAQUE IOHEXOL 350 MG/ML SOLN COMPARISON:  X-ray 07/29/2019 FINDINGS: Cardiovascular: Prominent thrombus within the lobar, segmental, and subsegmental branches of the right lower lobe (series 6, image 68). Additional thrombi within segmental and subsegmental branch pulmonary arteries of the left lower lobe (series 6, image 74). Segmental branch right upper lobe filling defect (series 6, image 57). No left upper lobe pulmonary arterial filling defects. No saddle embolism. RV to LV ratio of 1.0 with flattening of the interventricular septum. Heart size within normal limits. No pericardial effusion. Thoracic aorta is nonaneurysmal. Scattered  coronary artery calcification. Mediastinum/Nodes: No enlarged mediastinal, hilar, or axillary lymph nodes. Thyroid gland, trachea, and esophagus demonstrate no significant findings. Lungs/Pleura: Patchy consolidation and ground-glass opacity within the right lower lobe. Extensive bibasilar atelectasis. No pleural effusion or pneumothorax. Upper Abdomen: No acute findings. Musculoskeletal: No chest wall abnormality. No acute or significant osseous findings. Review of the MIP images confirms the above findings. IMPRESSION: 1. Extensive bilateral lobar, segmental, and subsegmental pulmonary emboli within the bilateral lower lobe pulmonary arteries, right worse than left. There is also involvement of a segmental branch artery of the right upper lobe. 2. RV to LV ratio of 1.0, which can be seen with right heart strain. 3. Patchy consolidation and ground-glass opacity within the right lower lobe may represent pneumonia versus pulmonary infarction. 4. Extensive bibasilar atelectasis. These results were called by telephone at the time of interpretation on 07/29/2019 at 12:58 pm to provider Lorre Nick , who verbally acknowledged these results. Electronically Signed   By: Duanne Guess D.O.   On: 07/29/2019 13:00   ECHOCARDIOGRAM COMPLETE  Result Date: 07/30/2019    ECHOCARDIOGRAM REPORT   Patient Name:   Patrick Warner Date of Exam: 07/30/2019 Medical Rec #:  [id number]  Height:       74.0 in Accession #:    [id number]           Weight:       225.0 lb Date of Birth:  [birth year]            BSA:          2.285 m Patient Age:    64 years             BP:           126/77 mmHg Patient Gender: M                    HR:           78 bpm. Exam Location:  Inpatient Procedure: 2D Echo Indications:    Abnormal ECG 794.31 / R94.31  History:        Patient has no prior history of Echocardiogram examinations.                 Signs/Symptoms:Dyspnea and Shortness of Breath; Risk                 Factors:Current Smoker.  Submassive Bilateral Pulmonary Emboli.  Sonographer:    Leta Jungling RDCS Referring Phys: [phone] Estevan Ryder MCINTYRE IMPRESSIONS  1. Left ventricular ejection fraction, by estimation, is 60 to 65%. The left ventricle has normal function. The left ventricle has no regional wall motion abnormalities. Left ventricular diastolic parameters were normal.  2. Right ventricular systolic function is normal. The right ventricular size is normal.  3. The mitral valve is normal in structure. Trivial mitral valve regurgitation.  4. The aortic valve is normal in structure. Aortic valve regurgitation is not visualized.  5. Aortic dilatation noted. There is mild dilatation of the aortic root measuring 40 mm. FINDINGS  Left Ventricle: Left ventricular ejection fraction, by estimation, is 60 to 65%. The left ventricle has normal function. The left ventricle has no regional wall motion abnormalities. The left ventricular internal cavity size was normal in size. There is  no left ventricular hypertrophy. Left ventricular diastolic parameters were normal. Right Ventricle: The right ventricular size is normal. No increase in right ventricular wall thickness. Right ventricular systolic function is normal. Left Atrium: Left atrial size was normal in size. Right Atrium: Right atrial size was normal in size. Pericardium: There is no evidence of pericardial effusion. Mitral Valve: The mitral valve is normal in structure. Trivial mitral valve regurgitation. Tricuspid Valve: The tricuspid valve is normal in structure. Tricuspid valve regurgitation is trivial. Aortic Valve: The aortic valve is normal in structure. Aortic valve regurgitation is not visualized. Pulmonic Valve: The pulmonic valve was normal in structure. Pulmonic valve regurgitation is trivial. Aorta: Aortic dilatation noted. There is mild dilatation of the aortic root measuring 40 mm. IAS/Shunts: No atrial level shunt detected by color flow Doppler.  LEFT VENTRICLE PLAX 2D LVIDd:          5.00 cm  Diastology LVIDs:         3.30 cm  LV e' lateral:   9.36 cm/s LV PW:         0.80 cm  LV E/e' lateral: 5.5 LV IVS:        0.90 cm  LV e' medial:    7.72 cm/s LVOT diam:     2.20 cm  LV E/e' medial:  6.6 LV SV:         69 LV SV Index:   30 LVOT  Area:     3.80 cm  RIGHT VENTRICLE RV S prime:     12.90 cm/s TAPSE (M-mode): 2.3 cm LEFT ATRIUM             Index       RIGHT ATRIUM           Index LA diam:        3.90 cm 1.71 cm/m  RA Area:     21.10 cm LA Vol (A2C):   65.0 ml 28.45 ml/m RA Volume:   56.40 ml  24.68 ml/m LA Vol (A4C):   50.5 ml 22.10 ml/m LA Biplane Vol: 58.0 ml 25.38 ml/m  AORTIC VALVE LVOT Vmax:   114.00 cm/s LVOT Vmean:  75.200 cm/s LVOT VTI:    0.182 m  AORTA Ao Root diam: 4.00 cm MITRAL VALVE MV Area (PHT): 2.34 cm    SHUNTS MV Decel Time: 324 msec    Systemic VTI:  0.18 m MV E velocity: 51.10 cm/s  Systemic Diam: 2.20 cm MV A velocity: 45.90 cm/s MV E/A ratio:  1.11 Dietrich Pates MD Electronically signed by Dietrich Pates MD Signature Date/Time: 07/30/2019/6:16:27 PM    Final     Results/Tests Pending at Time of Discharge: None   Discharge Medications:  Allergies as of 07/31/2019   No Known Allergies      Medication List     TAKE these medications    Apixaban Starter Pack (10mg  and 5mg ) Commonly known as: ELIQUIS STARTER PACK Take as directed on package: start with two-5mg  tablets twice daily for 7 days. On day 8, switch to one-5mg  tablet twice daily.        Discharge Instructions: Please refer to Patient Instructions section of EMR for full details.  Patient was counseled important signs and symptoms that should prompt return to medical care, changes in medications, dietary instructions, activity restrictions, and follow up appointments.   Follow-Up Appointments: Follow-up Information     [facility]. Go on 08/06/2019.   Why: You have been scheduled for an appointment on 08/06/19 @ 11:00am. Please see address above. Bring your  medications with you to this appointment. If you can not make this appoiuntment please call the number above to reschedule.  Contact information: [address] [place] [address] [phone]           [id number], MD 08/01/2019, 11:46 AM PGY-1, [facility]

## 2019-08-03 LAB — CULTURE, BLOOD (ROUTINE X 2)
Culture: NO GROWTH
Culture: NO GROWTH
Special Requests: ADEQUATE

## 2019-08-06 ENCOUNTER — Other Ambulatory Visit: Payer: Self-pay

## 2019-08-06 ENCOUNTER — Ambulatory Visit (HOSPITAL_COMMUNITY)
Admission: RE | Admit: 2019-08-06 | Discharge: 2019-08-06 | Disposition: A | Discharge status: Home / Self Care | Payer: Medicaid Other | Source: Ambulatory Visit | Attending: Critical Care Medicine | Admitting: Critical Care Medicine

## 2019-08-06 ENCOUNTER — Ambulatory Visit: Payer: Self-pay | Attending: Critical Care Medicine | Admitting: Critical Care Medicine

## 2019-08-06 ENCOUNTER — Encounter: Payer: Self-pay | Admitting: Critical Care Medicine

## 2019-08-06 VITALS — BP 130/90 | Temp 98.1°F | Resp 18 | Ht 74.0 in | Wt 207.0 lb

## 2019-08-06 DIAGNOSIS — M25552 Pain in left hip: Secondary | ICD-10-CM

## 2019-08-06 DIAGNOSIS — R042 Hemoptysis: Secondary | ICD-10-CM | POA: Insufficient documentation

## 2019-08-06 DIAGNOSIS — Z1159 Encounter for screening for other viral diseases: Secondary | ICD-10-CM

## 2019-08-06 DIAGNOSIS — Z7901 Long term (current) use of anticoagulants: Secondary | ICD-10-CM | POA: Insufficient documentation

## 2019-08-06 DIAGNOSIS — Z87891 Personal history of nicotine dependence: Secondary | ICD-10-CM | POA: Insufficient documentation

## 2019-08-06 DIAGNOSIS — I4510 Unspecified right bundle-branch block: Secondary | ICD-10-CM | POA: Insufficient documentation

## 2019-08-06 DIAGNOSIS — I2699 Other pulmonary embolism without acute cor pulmonale: Secondary | ICD-10-CM

## 2019-08-06 DIAGNOSIS — I2693 Single subsegmental pulmonary embolism without acute cor pulmonale: Secondary | ICD-10-CM | POA: Insufficient documentation

## 2019-08-06 DIAGNOSIS — G8929 Other chronic pain: Secondary | ICD-10-CM | POA: Insufficient documentation

## 2019-08-06 MED ORDER — APIXABAN 5 MG PO TABS: 5.0000 mg | ORAL_TABLET | Freq: Two times a day (BID) | ORAL | 6 refills | Status: DC

## 2019-08-06 NOTE — Progress Notes (Signed)
Hospital F/u  

## 2019-08-06 NOTE — Patient Instructions (Signed)
Stay on Eliquis twice daily , we will give you refills here with patient assistance  Labs today  Xray of hip ordered   Return Dr Delford Field 1 month video visit

## 2019-08-06 NOTE — Progress Notes (Signed)
Subjective:    Patient ID: Patrick Warner, male    DOB: [birth year], 64 y.o.   MRN: [id number]  64 y.o.M  HFU for submassive PE  This patient was admitted between the fifth and 8 May for submassive pulmonary emboli.  Patient comes in today for post hospital follow-up visit.  Below is the discharge summary  Dcsummary:   Submassive Pulmonary Emboli Patient presented to the ED with SOB, was treated with supplental oxygen and underwent CXR with concern for CAP. He was treated with azithro and ctx for one time dosing but given that he was afebrile and without leukocytosis and CTA suggested pulmonary findings were potentially due to infarction from emboli, these antibiotics were not continued as patient was thought to not have pneumonia. Patient did report new hemoptysis in addition to his chronic smoker's cough. EKG on admission showed right bundle branch block and no atrial fibrillation nor signs of ischemia with troponin levels that were unremarkable at 14 and 14. CTA was completed and showed Extensive bilateral lobar, segmental, and subsegmental pulmonary emboli within the bilateral lower lobe pulmonary arteries with involvement of a segmental branchartery of the right upper lobe. CTA had RV to LV ratio of 1.0, usually seen with right heart strain as well as patchy consolidation and ground-glass opacity within the right lower lobe may have represented pneumonia versus pulmonary infarction.The patient was started on heparin drip and on 5/6, was transitioned to Eliquis. Patient had no PCP on admission so TOC was conuslted to assist in [facility] the [facility]. Patient was scheduled for follow up at the time of discharge. Echocardiogram was completed and showed LVEF 60-65% with some aortic root dilation. Patient was also evaluated by physical therapy while admitted with no follow up recommendations. He did not require supplemental oxygen upon  discharge.   The patient states since discharge he is no longer drinking alcohol and is not currently smoking.  He states his breathing is improved and he is having no active complaints.  He does complain of some left chronic hip pain.  The patient is maintaining Eliquis and will need patient assistance for this.  The patient did not undergo any hypercoagulable work-up while in the hospital  The patient did have a history of atrial fibrillation in the past but is not on current treatment for this as he is no longer in atrial fibrillation  Note there was a pulmonary infarct in the right lower lobe which initially look suspicious for pneumonia but pneumonia was ruled out   Past Medical History:  Diagnosis Date  . Abnormal heart rhythm    "fibrillation of heart", diagnosed 4 years ago     Family History  Problem Relation Age of Onset  . Diabetes Mother      Social History   Socioeconomic History  . Marital status: Single    Spouse name: Not on file  . Number of children: Not on file  . Years of education: Not on file  . Highest education level: Not on file  Occupational History  . Not on file  Tobacco Use  . Smoking status: Former Smoker    Packs/day: 0.50    Years: 45.00    Pack years: 22.50    Types: Cigarettes, Cigars    Quit date: 07/30/2019    Years since quitting: 0.0  . Smokeless tobacco: Never Used  Substance and Sexual Activity  . Alcohol use: Yes    Alcohol/week: 12.0 standard drinks  Types: 12 Cans of beer per week    Comment: 4 drinks per day on weekends   . Drug use: Never  . Sexual activity: Not on file  Other Topics Concern  . Not on file  Social History Narrative  . Not on file   Social Determinants of Health   Financial Resource Strain:   . Difficulty of Paying Living Expenses:   Food Insecurity:   . Worried About Programme researcher, broadcasting/film/video in the Last Year:   . Barista in the Last Year:   Transportation Needs:   . Freight forwarder  (Medical):   Marland Kitchen Lack of Transportation (Non-Medical):   Physical Activity:   . Days of Exercise per Week:   . Minutes of Exercise per Session:   Stress:   . Feeling of Stress :   Social Connections:   . Frequency of Communication with Friends and Family:   . Frequency of Social Gatherings with Friends and Family:   . Attends Religious Services:   . Active Member of Clubs or Organizations:   . Attends Banker Meetings:   Marland Kitchen Marital Status:   Intimate Partner Violence:   . Fear of Current or Ex-Partner:   . Emotionally Abused:   Marland Kitchen Physically Abused:   . Sexually Abused:      No Known Allergies   Outpatient Medications Prior to Visit  Medication Sig Dispense Refill  . APIXABAN (ELIQUIS) VTE STARTER PACK (10MG  AND 5MG ) Take as directed on package: start with two-5mg  tablets twice daily for 7 days. On day 8, switch to one-5mg  tablet twice daily. 1 each 0   No facility-administered medications prior to visit.       Review of Systems  Constitutional: Negative.   HENT: Negative.   Eyes: Negative.   Respiratory: Negative.   Cardiovascular: Negative.   Gastrointestinal: Negative.   Genitourinary: Negative.   Musculoskeletal: Negative.   Neurological: Negative.   Hematological: Does not bruise/bleed easily.  Psychiatric/Behavioral: Negative.        Objective:   Physical Exam Vitals:   08/06/19 1035  BP: 130/90  Resp: 18  Temp: 98.1 F (36.7 C)  SpO2: 99%  Weight: 207 lb (93.9 kg)  Height: 6\' 2"  (1.88 m)    Gen: Pleasant, well-nourished, in no distress,  normal affect  ENT: No lesions,  mouth clear,  oropharynx clear, no postnasal drip  Neck: No JVD, no TMG, no carotid bruits  Lungs: No use of accessory muscles, no dullness to percussion, clear without rales or rhonchi  Cardiovascular: RRR, heart sounds normal, no murmur or gallops, no peripheral edema  Abdomen: soft and NT, no HSM,  BS normal  Musculoskeletal: No deformities, no cyanosis or  clubbing  Neuro: alert, non focal  Skin: Warm, no lesions or rashes   Significant Labs and Imaging:  Last Labs  Recent Labs Lab 07/29/19 0858 07/30/19 0319 07/31/19 0337 WBC 11.4* 10.7* 8.0 HGB 14.2 12.4* 12.3* HCT 45.6 40.0 39.0 PLT 239 210 261   Last Labs  Recent Labs Lab 07/29/19 0858 07/29/19 0858 07/30/19 0319 07/31/19 0337 NA 137  --  137 136 K 3.6   < > 3.8 3.5 CL 100  --  104 104 CO2 24  --  23 24 GLUCOSE 120*  --  100* 93 BUN 6*  --  7* 8 CREATININE 1.07  --  1.00 1.00 CALCIUM 10.1  --  9.3 9.2 ALKPHOS  --   --  86 90 AST  --   --  59* 54* ALT  --   --  47* 52* ALBUMIN  --   --  2.5* 2.3*  < > = values in this interval not displayed.    DG Chest 2 View  Result Date: 07/29/2019 CLINICAL DATA:  Shortness of breath EXAM: CHEST - 2 VIEW COMPARISON:  None. FINDINGS: There is atelectatic change bilaterally with small left pleural effusion. There is ill-defined opacity in the right mid and lower lung regions. Heart size and pulmonary vascularity are normal. No adenopathy. There is degenerative change in the thoracic spine. IMPRESSION: Ill-defined airspace opacity in the right mid and lower lung regions consistent pneumonia involving several lung segments. Bibasilar atelectasis with small left pleural effusion. Cardiac silhouette normal.  No adenopathy. These findings may warrant correlation with COVID-19 status. Electronically Signed   By: Bretta Bang III M.D.   On: 07/29/2019 09:36   CT Angio Chest PE W/Cm &/Or Wo Cm  Result Date: 07/29/2019 CLINICAL DATA:  Shortness of breath EXAM: CT ANGIOGRAPHY CHEST WITH CONTRAST TECHNIQUE: Multidetector CT imaging of the chest was performed using the standard protocol during bolus administration of intravenous contrast. Multiplanar CT image reconstructions and MIPs were obtained to evaluate the vascular anatomy. CONTRAST:  OMNIPAQUE IOHEXOL 350 MG/ML SOLN COMPARISON:  X-ray 07/29/2019 FINDINGS: Cardiovascular:  Prominent thrombus within the lobar, segmental, and subsegmental branches of the right lower lobe (series 6, image 68). Additional thrombi within segmental and subsegmental branch pulmonary arteries of the left lower lobe (series 6, image 74). Segmental branch right upper lobe filling defect (series 6, image 57). No left upper lobe pulmonary arterial filling defects. No saddle embolism. RV to LV ratio of 1.0 with flattening of the interventricular septum. Heart size within normal limits. No pericardial effusion. Thoracic aorta is nonaneurysmal. Scattered coronary artery calcification. Mediastinum/Nodes: No enlarged mediastinal, hilar, or axillary lymph nodes. Thyroid gland, trachea, and esophagus demonstrate no significant findings. Lungs/Pleura: Patchy consolidation and ground-glass opacity within the right lower lobe. Extensive bibasilar atelectasis. No pleural effusion or pneumothorax. Upper Abdomen: No acute findings. Musculoskeletal: No chest wall abnormality. No acute or significant osseous findings. Review of the MIP images confirms the above findings. IMPRESSION: 1. Extensive bilateral lobar, segmental, and subsegmental pulmonary emboli within the bilateral lower lobe pulmonary arteries, right worse than left. There is also involvement of a segmental branch artery of the right upper lobe. 2. RV to LV ratio of 1.0, which can be seen with right heart strain. 3. Patchy consolidation and ground-glass opacity within the right lower lobe may represent pneumonia versus pulmonary infarction. 4. Extensive bibasilar atelectasis. These results were called by telephone at the time of interpretation on 07/29/2019 at 12:58 pm to provider Lorre Nick , who verbally acknowledged these results. Electronically Signed   By: Duanne Guess D.O.   On: 07/29/2019 13:00   ECHOCARDIOGRAM COMPLETE  Result Date: 07/30/2019    ECHOCARDIOGRAM REPORT   Patient Name:   KIPLING GRASER Chaidez Date of Exam: 07/30/2019 Medical Rec #:   [id number]            Height:       74.0 in Accession #:    [id number]           Weight:       225.0 lb Date of Birth:  [birth year]            BSA:          2.285 m Patient Age:    49 years  BP:           126/77 mmHg Patient Gender: M                    HR:           78 bpm. Exam Location:  Inpatient Procedure: 2D Echo Indications:    Abnormal ECG 794.31 / R94.31  History:        Patient has no prior history of Echocardiogram examinations.                 Signs/Symptoms:Dyspnea and Shortness of Breath; Risk                 Factors:Current Smoker. Submassive Bilateral Pulmonary Emboli.  Sonographer:    Leta Junglingiffany Cooper RDCS Referring Phys: [phone] Estevan RyderBRITTANY J MCINTYRE IMPRESSIONS  1. Left ventricular ejection fraction, by estimation, is 60 to 65%. The left ventricle has normal function. The left ventricle has no regional wall motion abnormalities. Left ventricular diastolic parameters were normal.  2. Right ventricular systolic function is normal. The right ventricular size is normal.  3. The mitral valve is normal in structure. Trivial mitral valve regurgitation.  4. The aortic valve is normal in structure. Aortic valve regurgitation is not visualized.  5. Aortic dilatation noted. There is mild dilatation of the aortic root measuring 40 mm. FINDINGS  Left Ventricle: Left ventricular ejection fraction, by estimation, is 60 to 65%. The left ventricle has normal function. The left ventricle has no regional wall motion abnormalities. The left ventricular internal cavity size was normal in size. There is  no left ventricular hypertrophy. Left ventricular diastolic parameters were normal. Right Ventricle: The right ventricular size is normal. No increase in right ventricular wall thickness. Right ventricular systolic function is normal. Left Atrium: Left atrial size was normal in size. Right Atrium: Right atrial size was normal in size. Pericardium: There is no evidence of pericardial effusion. Mitral Valve: The mitral  valve is normal in structure. Trivial mitral valve regurgitation. Tricuspid Valve: The tricuspid valve is normal in structure. Tricuspid valve regurgitation is trivial. Aortic Valve: The aortic valve is normal in structure. Aortic valve regurgitation is not visualized. Pulmonic Valve: The pulmonic valve was normal in structure. Pulmonic valve regurgitation is trivial. Aorta: Aortic dilatation noted. There is mild dilatation of the aortic root measuring 40 mm. IAS/Shunts: No atrial level shunt detected by color flow Doppler.  LEFT VENTRICLE PLAX 2D LVIDd:         5.00 cm  Diastology LVIDs:         3.30 cm  LV e' lateral:   9.36 cm/s LV PW:         0.80 cm  LV E/e' lateral: 5.5 LV IVS:        0.90 cm  LV e' medial:    7.72 cm/s LVOT diam:     2.20 cm  LV E/e' medial:  6.6 LV SV:         69 LV SV Index:   30 LVOT Area:     3.80 cm  RIGHT VENTRICLE RV S prime:     12.90 cm/s TAPSE (M-mode): 2.3 cm LEFT ATRIUM             Index       RIGHT ATRIUM           Index LA diam:        3.90 cm 1.71 cm/m  RA Area:     21.10 cm LA Vol (A2C):  65.0 ml 28.45 ml/m RA Volume:   56.40 ml  24.68 ml/m LA Vol (A4C):   50.5 ml 22.10 ml/m LA Biplane Vol: 58.0 ml 25.38 ml/m  AORTIC VALVE LVOT Vmax:   114.00 cm/s LVOT Vmean:  75.200 cm/s LVOT VTI:    0.182 m  AORTA Ao Root diam: 4.00 cm MITRAL VALVE MV Area (PHT): 2.34 cm    SHUNTS MV Decel Time: 324 msec    Systemic VTI:  0.18 m MV E velocity: 51.10 cm/s  Systemic Diam: 2.20 cm MV A velocity: 45.90 cm/s MV E/A ratio:  1.11 Dorris Carnes MD Electronically signed by Dorris Carnes MD Signature Date/Time: 07/30/2019/6:16:27 PM    Final            Assessment & Plan:  I personally reviewed all images and lab data in the [facility] system as well as any outside material available during this office visit and agree with the  radiology impressions.   Pulmonary embolism ([place]) Pulmonary embolism which appears to be nonprovoked however the patient does have chronic left hip pain which may play a  role  Note venous Dopplers were not obtained at the hospitalization only echocardiogram which was normal  No evidence of active atrial fibrillation at this time  We will plan at least 6 months of therapy and will need to obtain hypercoagulable work-up  Left hip pain Chronic left hip pain  We will obtain x-rays   Diagnoses and all orders for this visit:  Acute pulmonary embolism without acute cor pulmonale, unspecified pulmonary embolism type (HCC) -     Comprehensive metabolic panel -     CBC with Differential/Platelet  Need for hepatitis C screening test -     Hepatitis c antibody (reflex)  Left hip pain -     DG Hip Unilat W OR W/O Pelvis 2-3 Views Left; Future  Other orders -     apixaban (ELIQUIS) 5 MG TABS tablet; Take 1 tablet (5 mg total) by mouth 2 (two) times daily. Start when starter pak runs out -     HCV Comment:   We will also obtain hepatitis C study

## 2019-08-07 ENCOUNTER — Other Ambulatory Visit: Payer: Self-pay | Admitting: Critical Care Medicine

## 2019-08-07 DIAGNOSIS — M25552 Pain in left hip: Secondary | ICD-10-CM | POA: Insufficient documentation

## 2019-08-07 LAB — COMPREHENSIVE METABOLIC PANEL
ALT: 44 IU/L (ref 0–44)
AST: 32 IU/L (ref 0–40)
Albumin/Globulin Ratio: 0.9 — ABNORMAL LOW (ref 1.2–2.2)
Albumin: 3.8 g/dL (ref 3.8–4.8)
Alkaline Phosphatase: 150 IU/L — ABNORMAL HIGH (ref 39–117)
BUN/Creatinine Ratio: 8 — ABNORMAL LOW (ref 10–24)
BUN: 8 mg/dL (ref 8–27)
Bilirubin Total: 0.2 mg/dL (ref 0.0–1.2)
CO2: 23 mmol/L (ref 20–29)
Calcium: 10.9 mg/dL — ABNORMAL HIGH (ref 8.6–10.2)
Chloride: 102 mmol/L (ref 96–106)
Creatinine, Ser: 1.06 mg/dL (ref 0.76–1.27)
GFR calc Af Amer: 87 mL/min/{1.73_m2} (ref >59)
GFR calc non Af Amer: 75 mL/min/{1.73_m2} (ref >59)
Globulin, Total: 4.2 g/dL (ref 1.5–4.5)
Glucose: 88 mg/dL (ref 65–99)
Potassium: 4.6 mmol/L (ref 3.5–5.2)
Sodium: 140 mmol/L (ref 134–144)
Total Protein: 8 g/dL (ref 6.0–8.5)

## 2019-08-07 LAB — CBC WITH DIFFERENTIAL/PLATELET
Basophils Absolute: 0.1 10*3/uL (ref 0.0–0.2)
Basos: 1 %
EOS (ABSOLUTE): 0.2 10*3/uL (ref 0.0–0.4)
Eos: 3 %
Hematocrit: 42.2 % (ref 37.5–51.0)
Hemoglobin: 13.5 g/dL (ref 13.0–17.7)
Immature Grans (Abs): 0 10*3/uL (ref 0.0–0.1)
Immature Granulocytes: 0 %
Lymphocytes Absolute: 2.1 10*3/uL (ref 0.7–3.1)
Lymphs: 34 %
MCH: 27 pg (ref 26.6–33.0)
MCHC: 32 g/dL (ref 31.5–35.7)
MCV: 84 fL (ref 79–97)
Monocytes Absolute: 0.7 10*3/uL (ref 0.1–0.9)
Monocytes: 12 %
Neutrophils Absolute: 3 10*3/uL (ref 1.4–7.0)
Neutrophils: 50 %
Platelets: 463 10*3/uL — ABNORMAL HIGH (ref 150–450)
RBC: 5 x10E6/uL (ref 4.14–5.80)
RDW: 13.8 % (ref 11.6–15.4)
WBC: 6 10*3/uL (ref 3.4–10.8)

## 2019-08-07 LAB — HCV COMMENT:

## 2019-08-07 LAB — HEPATITIS C ANTIBODY (REFLEX): HCV Ab: <0.1 s/co ratio (ref 0.0–0.9)

## 2019-08-07 NOTE — Assessment & Plan Note (Signed)
Chronic left hip pain  We will obtain x-rays

## 2019-08-07 NOTE — Assessment & Plan Note (Signed)
Pulmonary embolism which appears to be nonprovoked however the patient does have chronic left hip pain which may play a role  Note venous Dopplers were not obtained at the hospitalization only echocardiogram which was normal  No evidence of active atrial fibrillation at this time  We will plan at least 6 months of therapy and will need to obtain hypercoagulable work-up

## 2019-08-21 ENCOUNTER — Ambulatory Visit: Payer: Medicaid Other | Admitting: Orthopaedic Surgery

## 2019-08-25 ENCOUNTER — Ambulatory Visit (INDEPENDENT_AMBULATORY_CARE_PROVIDER_SITE_OTHER): Payer: Self-pay | Admitting: Orthopaedic Surgery

## 2019-08-25 ENCOUNTER — Other Ambulatory Visit: Payer: Self-pay

## 2019-08-25 ENCOUNTER — Encounter: Payer: Self-pay | Admitting: Orthopaedic Surgery

## 2019-08-25 DIAGNOSIS — M1612 Unilateral primary osteoarthritis, left hip: Secondary | ICD-10-CM

## 2019-08-25 NOTE — Progress Notes (Signed)
Office Visit Note   Patient: Patrick Warner           Date of Birth: 1955/08/16           MRN: [id number] Visit Date: 08/25/2019              Requested by: Storm Frisk, MD [address] [place],  [place] [address] PCP: Storm Frisk, MD   Assessment & Plan: Visit Diagnoses:  1. Primary osteoarthritis of left hip     Plan: Impression is end-stage left hip DJD.  Reviewed the x-rays with the patient in detail and based on discussion of treatment options he would like to try cortisone injection with Dr. Prince Rome at a later time.  He will make an appointment for this.  We will see him back as needed.  We did provide him with an information packet for hip replacement surgery.  Follow-Up Instructions: Return for needs appt with [facility] for left hip joint injection.   Orders:  No orders of the defined types were placed in this encounter.  No orders of the defined types were placed in this encounter.     Procedures: No procedures performed   Clinical Data: No additional findings.   Subjective: Chief Complaint  Patient presents with  . Left Hip - Pain    Mr. Fergusson is a 64 year old gentleman comes in for chronic left hip pain.  He endorses pain deep in the hip region.  Denies any true groin pain.  He denies any radicular symptoms.  Denies any injuries.  He does take Eliquis for history of PE.   Review of Systems  Constitutional: Negative.   All other systems reviewed and are negative.    Objective: Vital Signs: There were no vitals taken for this visit.  Physical Exam Vitals and nursing note reviewed.  Constitutional:      Appearance: He is well-developed.  HENT:     Head: Normocephalic and atraumatic.  Eyes:     Pupils: Pupils are equal, round, and reactive to light.  Pulmonary:     Effort: Pulmonary effort is normal.  Abdominal:     Palpations: Abdomen is soft.  Musculoskeletal:        General: Normal range of motion.     Cervical back: Neck  supple.  Skin:    General: Skin is warm.  Neurological:     Mental Status: He is alert and oriented to person, place, and time.  Psychiatric:        Behavior: Behavior normal.        Thought Content: Thought content normal.        Judgment: Judgment normal.     Ortho Exam Left hip shows minimal range of motion with catching pain.  Positive Stinchfield and positive logroll.  No tenderness to palpation of the lateral side of the hip. Specialty Comments:  No specialty comments available.  Imaging: No results found.   PMFS History: Patient Active Problem List   Diagnosis Date Noted  . Left hip pain 08/07/2019  . Pulmonary embolism (HCC) 07/29/2019  . Hx of atrial fibrillation without current medication    Past Medical History:  Diagnosis Date  . Abnormal heart rhythm    "fibrillation of heart", diagnosed 4 years ago    Family History  Problem Relation Age of Onset  . Diabetes Mother     Past Surgical History:  Procedure Laterality Date  . CARDIOVERSION     around 2017   Social History  Occupational History  . Not on file  Tobacco Use  . Smoking status: Former Smoker    Packs/day: 0.50    Years: 45.00    Pack years: 22.50    Types: Cigarettes, Cigars    Quit date: 07/30/2019    Years since quitting: 0.0  . Smokeless tobacco: Never Used  Substance and Sexual Activity  . Alcohol use: Yes    Alcohol/week: 12.0 standard drinks    Types: 12 Cans of beer per week    Comment: 4 drinks per day on weekends   . Drug use: Never  . Sexual activity: Not on file

## 2019-09-01 ENCOUNTER — Ambulatory Visit: Payer: Medicaid Other | Admitting: Family Medicine

## 2019-09-07 ENCOUNTER — Telehealth: Payer: Self-pay | Admitting: Family Medicine

## 2019-09-07 ENCOUNTER — Ambulatory Visit (INDEPENDENT_AMBULATORY_CARE_PROVIDER_SITE_OTHER): Payer: Self-pay | Admitting: Family Medicine

## 2019-09-07 ENCOUNTER — Encounter: Payer: Self-pay | Admitting: Family Medicine

## 2019-09-07 ENCOUNTER — Ambulatory Visit: Payer: Self-pay

## 2019-09-07 ENCOUNTER — Other Ambulatory Visit: Payer: Self-pay

## 2019-09-07 ENCOUNTER — Ambulatory Visit: Payer: Medicaid Other | Admitting: Critical Care Medicine

## 2019-09-07 DIAGNOSIS — M1612 Unilateral primary osteoarthritis, left hip: Secondary | ICD-10-CM

## 2019-09-07 MED ORDER — HYDROCODONE-ACETAMINOPHEN 5-325 MG PO TABS: 1.0000 | ORAL_TABLET | Freq: Three times a day (TID) | ORAL | 0 refills | Status: DC | PRN

## 2019-09-07 MED FILL — !ELIQUIS 5 MG TABLET: 5 | 30 days supply | Qty: 60 | Fill #1

## 2019-09-07 NOTE — Telephone Encounter (Signed)
Sent!

## 2019-09-07 NOTE — Telephone Encounter (Signed)
Pharmacy called.   [facility] cannot fill the patient's rx. Must be sent to the [facility] pharmacy in the chart.

## 2019-09-07 NOTE — Addendum Note (Signed)
Addended by: Lillia Carmel on: 09/07/2019 11:48 AM   Modules accepted: Orders

## 2019-09-07 NOTE — Telephone Encounter (Signed)
See below

## 2019-09-07 NOTE — Progress Notes (Signed)
Subjective: Patient is here for ultrasound-guided intra-articular left hip injection.   He has moderate to severe DJD.  He is trying to avoid surgery for a while.  Objective: Unable to fully extend his hip.  He has pain with internal rotation.  Limited range of motion.  Procedure: Ultrasound-guided left hip injection: After sterile prep with Betadine, injected 8 cc 1% lidocaine without epinephrine and 40 mg methylprednisolone using a 22-gauge spinal needle, passing the needle through the iliofemoral ligament into the femoral head/neck junction.  Injectate seen filling the joint capsule.  Good immediate relief.

## 2019-09-08 ENCOUNTER — Telehealth: Payer: Self-pay | Admitting: Family Medicine

## 2019-09-08 MED ORDER — HYDROCODONE-ACETAMINOPHEN 5-325 MG PO TABS: 1.0000 | ORAL_TABLET | Freq: Three times a day (TID) | ORAL | 0 refills | Status: DC | PRN

## 2019-09-08 MED FILL — HYDROCODON-APAP 5-325: 5-325 | 7 days supply | Qty: 20 | Fill #0

## 2019-09-08 NOTE — Addendum Note (Signed)
Addended by: Lillia Carmel on: 09/08/2019 09:56 AM   Modules accepted: Orders

## 2019-09-08 NOTE — Telephone Encounter (Signed)
Please advise 

## 2019-09-08 NOTE — Telephone Encounter (Signed)
Sent!

## 2019-09-08 NOTE — Telephone Encounter (Signed)
Pt called stating the pharmacy Dr. Prince Rome sent his rx to doesn't carry that medicine so the pt would like to have the rx called into the [facility] pharmacy on [address] [place].   [phone]

## 2019-09-21 MED FILL — HYDROCODON-APAP 5-325: 5-325 | 7 days supply | Qty: 20 | Fill #0

## 2019-09-24 ENCOUNTER — Ambulatory Visit: Payer: Self-pay | Attending: Critical Care Medicine | Admitting: Critical Care Medicine

## 2019-09-24 ENCOUNTER — Other Ambulatory Visit: Payer: Self-pay

## 2019-09-24 ENCOUNTER — Encounter: Payer: Self-pay | Admitting: Critical Care Medicine

## 2019-09-24 VITALS — BP 117/79 | HR 83 | Temp 98.1°F | Resp 16 | Ht 74.0 in | Wt 224.0 lb

## 2019-09-24 DIAGNOSIS — Z8679 Personal history of other diseases of the circulatory system: Secondary | ICD-10-CM

## 2019-09-24 DIAGNOSIS — Z6828 Body mass index (BMI) 28.0-28.9, adult: Secondary | ICD-10-CM

## 2019-09-24 DIAGNOSIS — Z1211 Encounter for screening for malignant neoplasm of colon: Secondary | ICD-10-CM

## 2019-09-24 DIAGNOSIS — I2699 Other pulmonary embolism without acute cor pulmonale: Secondary | ICD-10-CM

## 2019-09-24 DIAGNOSIS — Z23 Encounter for immunization: Secondary | ICD-10-CM

## 2019-09-24 DIAGNOSIS — M1612 Unilateral primary osteoarthritis, left hip: Secondary | ICD-10-CM

## 2019-09-24 MED ORDER — HYDROCODONE-ACETAMINOPHEN 5-325 MG PO TABS: 1.0000 | ORAL_TABLET | Freq: Three times a day (TID) | ORAL | 0 refills | Status: DC | PRN

## 2019-09-24 NOTE — Progress Notes (Signed)
Subjective:    Patient ID: Patrick Warner, male    DOB: 07-18-1955, 64 y.o.   MRN: [id number]  64 y.o.M  HFU for submassive PE  This patient was admitted between the fifth and 8 May for submassive pulmonary emboli.  Patient comes in today for post hospital follow-up visit.  Below is the discharge summary  Dcsummary:   Submassive Pulmonary Emboli Patient presented to the ED with SOB, was treated with supplental oxygen and underwent CXR with concern for CAP. He was treated with azithro and ctx for one time dosing but given that he was afebrile and without leukocytosis and CTA suggested pulmonary findings were potentially due to infarction from emboli, these antibiotics were not continued as patient was thought to not have pneumonia. Patient did report new hemoptysis in addition to his chronic smoker's cough. EKG on admission showed right bundle branch block and no atrial fibrillation nor signs of ischemia with troponin levels that were unremarkable at 14 and 14. CTA was completed and showed Extensive bilateral lobar, segmental, and subsegmental pulmonary emboli within the bilateral lower lobe pulmonary arteries with involvement of a segmental branchartery of the right upper lobe. CTA had RV to LV ratio of 1.0, usually seen with right heart strain as well as patchy consolidation and ground-glass opacity within the right lower lobe may have represented pneumonia versus pulmonary infarction.The patient was started on heparin drip and on 5/6, was transitioned to Eliquis. Patient had no PCP on admission so TOC was conuslted to assist in getting the patient [facility]. Patient was scheduled for follow up at the time of discharge. Echocardiogram was completed and showed LVEF 60-65% with some aortic root dilation. Patient was also evaluated by physical therapy while admitted with no follow up recommendations. He did not require supplemental oxygen upon  discharge.   The patient states since discharge he is no longer drinking alcohol and is not currently smoking.  He states his breathing is improved and he is having no active complaints.  He does complain of some left chronic hip pain.  The patient is maintaining Eliquis and will need patient assistance for this.  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  The patient has great deal of difficulty with any type of mobilization and is seen orthopedics and they recommended a hip replacement he wishes to defer this for now.  He is on 5 mg of hydrocodone and acetaminophen 3 times daily with some relief.  He has run out of his 20 pills he was given on 15 June. Patient has no prior history of drug use substance use alcohol use.  No psychiatric history.  No strong positive family history.  His opioid use risk is low based on the opioid risk scale  Patient maintains apixaban twice daily doing well with this without bleeding.  The patient has not gone back into atrial fibrillation since his last visit.  The patient maintains a good blood pressure and good pulse rate at this time.  Note metabolic panel CBC were  normal hepatitis C negative at last visit.  Patient received a cortisone injection left hip with minimal relief.  He is considering the hip replacement wishes to hold off at this time.  He does have an elevated BMI and we did discuss need for some weight loss.  The patient is yet copy of Covid vaccine and he is still pondering this.  He does know he needs a  Tdap at this visit.  Also needs colon cancer screening.  Past Medical History:  Diagnosis Date  . Abnormal heart rhythm    "fibrillation of heart", diagnosed 4 years ago     Family History  Problem Relation Age of Onset  . Diabetes Mother      Social History   Socioeconomic History  . Marital status: Single    Spouse name: Not on file  . Number of children: Not on file  . Years of education: Not on file  . Highest education level: Not on file  Occupational History  . Not on file  Tobacco Use  . Smoking status: Former Smoker    Packs/day: 0.50    Years: 45.00    Pack years: 22.50    Types: Cigarettes, Cigars    Quit date: 07/30/2019    Years since quitting: 0.1  . Smokeless tobacco: Never Used  Substance and Sexual Activity  . Alcohol use: Yes    Alcohol/week: 12.0 standard drinks    Types: 12 Cans of beer per week    Comment: 4 drinks per day on weekends   . Drug use: Never  . Sexual activity: Not on file  Other Topics Concern  . Not on file  Social History Narrative  . Not on file   Social Determinants of Health   Financial Resource Strain:   . Difficulty of Paying Living Expenses:   Food Insecurity:   . Worried About Programme researcher, broadcasting/film/videounning Out of Food in the Last Year:   . Baristaan Out of Food in the Last Year:   Transportation Needs:   . Freight forwarderLack of Transportation (Medical):   Marland Kitchen. Lack of Transportation (Non-Medical):   Physical Activity:   . Days of Exercise per Week:   . Minutes of Exercise per Session:   Stress:   . Feeling of Stress :   Social Connections:   . Frequency of Communication with Friends and Family:   . Frequency of Social Gatherings with Friends and Family:   . Attends Religious Services:   . Active Member of Clubs or Organizations:   . Attends BankerClub or Organization Meetings:   Marland Kitchen. Marital Status:   Intimate Partner Violence:   . Fear of Current or Ex-Partner:   . Emotionally Abused:   Marland Kitchen. Physically Abused:   . Sexually Abused:      No Known Allergies    Outpatient Medications Prior to Visit  Medication Sig Dispense Refill  . apixaban (ELIQUIS) 5 MG TABS tablet Take 1 tablet (5 mg total) by mouth 2 (two) times daily. Start when starter pak runs out 60 tablet 6  . HYDROcodone-acetaminophen (NORCO/VICODIN) 5-325 MG tablet Take 1 tablet by mouth 3 (three) times daily as needed for moderate pain. 20 tablet 0  . APIXABAN (ELIQUIS) VTE STARTER PACK (10MG  AND 5MG ) Take as directed on package: start with two-5mg  tablets twice daily for 7 days. On day 8, switch to one-5mg  tablet twice daily. (Patient not taking: Reported on 09/24/2019) 1 each 0   No facility-administered medications prior to visit.       Review  of Systems  Constitutional: Negative.   HENT: Negative.   Eyes: Negative.   Respiratory: Negative.   Cardiovascular: Negative.   Gastrointestinal: Negative.   Genitourinary: Negative.   Musculoskeletal: Negative.   Neurological: Negative.   Hematological: Does not bruise/bleed easily.  Psychiatric/Behavioral: Negative.        Objective:   Physical Exam Vitals:   09/24/19 0835  BP: 117/79  Pulse: 83  Resp: 16  Temp: 98.1 F (36.7 C)  SpO2: 98%  Weight: 224 lb (101.6 kg)  Height:  (1.88 m)    Gen: Pleasant, mild obesity in no distress,  normal affect  ENT: No lesions,  mouth clear,  oropharynx clear, no postnasal drip  Neck: No JVD, no TMG, no carotid bruits  Lungs: No use of accessory muscles, no dullness to percussion, clear without rales or rhonchi  Cardiovascular: RRR, heart sounds normal, no murmur or gallops, no peripheral edema  Abdomen: soft and NT, no HSM,  BS normal  Musculoskeletal: No deformities, no cyanosis or clubbing  Neuro: alert, non focal  Skin: Warm, no lesions or rashes  PHQ 9 and gad 7 are normal  Significant Labs and Imaging:  Last Labs  Recent  Labs Lab 07/29/19 0858 07/30/19 0319 07/31/19 0337 WBC 11.4* 10.7* 8.0 HGB 14.2 12.4* 12.3* HCT 45.6 40.0 39.0 PLT 239 210 261   Last Labs  Recent Labs Lab 07/29/19 0858 07/29/19 0858 07/30/19 0319 07/31/19 0337 NA 137  --  137 136 K 3.6   < > 3.8 3.5 CL 100  --  104 104 CO2 24  --  23 24 GLUCOSE 120*  --  100* 93 BUN 6*  --  7* 8 CREATININE 1.07  --  1.00 1.00 CALCIUM 10.1  --  9.3 9.2 ALKPHOS  --   --  86 90 AST  --   --  59* 54* ALT  --   --  47* 52* ALBUMIN  --   --  2.5* 2.3*  < > = values in this interval not displayed.    DG Chest 2 View  Result Date: 07/29/2019 CLINICAL DATA:  Shortness of breath EXAM: CHEST - 2 VIEW COMPARISON:  None. FINDINGS: There is atelectatic change bilaterally with small left pleural effusion. There is ill-defined opacity in the right mid and lower lung regions. Heart size and pulmonary vascularity are normal. No adenopathy. There is degenerative change in the thoracic spine. IMPRESSION: Ill-defined airspace opacity in the right mid and lower lung regions consistent pneumonia involving several lung segments. Bibasilar atelectasis with small left pleural effusion. Cardiac silhouette normal.  No adenopathy. These findings may warrant correlation with COVID-19 status. Electronically Signed   By: Bretta Bang III M.D.   On: 07/29/2019 09:36   CT Angio Chest PE W/Cm &/Or Wo Cm  Result Date: 07/29/2019 CLINICAL DATA:  Shortness of breath EXAM: CT ANGIOGRAPHY CHEST WITH CONTRAST TECHNIQUE: Multidetector CT imaging of the chest was performed using the standard protocol during bolus administration of intravenous contrast. Multiplanar CT image reconstructions and MIPs were obtained to evaluate the vascular anatomy. CONTRAST:  OMNIPAQUE IOHEXOL 350 MG/ML SOLN COMPARISON:  X-ray 07/29/2019 FINDINGS: Cardiovascular: Prominent thrombus within the lobar, segmental, and subsegmental branches of the right lower lobe (series 6, image 68).  Additional thrombi within segmental and subsegmental branch pulmonary arteries of the left lower lobe (series 6, image 74). Segmental branch right upper lobe filling defect (series 6, image 57). No left upper lobe pulmonary arterial filling defects. No saddle embolism.  RV to LV ratio of 1.0 with flattening of the interventricular septum. Heart size within normal limits. No pericardial effusion. Thoracic aorta is nonaneurysmal. Scattered coronary artery calcification. Mediastinum/Nodes: No enlarged mediastinal, hilar, or axillary lymph nodes. Thyroid gland, trachea, and esophagus demonstrate no significant findings. Lungs/Pleura: Patchy consolidation and ground-glass opacity within the right lower lobe. Extensive bibasilar atelectasis. No pleural effusion or pneumothorax. Upper Abdomen: No acute findings. Musculoskeletal: No chest wall abnormality. No acute or significant osseous findings. Review of the MIP images confirms the above findings. IMPRESSION: 1. Extensive bilateral lobar, segmental, and subsegmental pulmonary emboli within the bilateral lower lobe pulmonary arteries, right worse than left. There is also involvement of a segmental branch artery of the right upper lobe. 2. RV to LV ratio of 1.0, which can be seen with right heart strain. 3. Patchy consolidation and ground-glass opacity within the right lower lobe may represent pneumonia versus pulmonary infarction. 4. Extensive bibasilar atelectasis. These results were called by telephone at the time of interpretation on 07/29/2019 at 12:58 pm to provider Lorre Nick , who verbally acknowledged these results. Electronically Signed   By: Duanne Guess D.O.   On: 07/29/2019 13:00   ECHOCARDIOGRAM COMPLETE  Result Date: 07/30/2019    ECHOCARDIOGRAM REPORT   Patient Name:   Patrick Warner Date of Exam: 07/30/2019 Medical Rec #:  [id number]            Height:       74.0 in Accession #:    [id number]           Weight:       225.0 lb Date of Birth:   1955-03-28            BSA:          2.285 m Patient Age:    64 years             BP:           126/77 mmHg Patient Gender: M                    HR:           78 bpm. Exam Location:  Inpatient Procedure: 2D Echo Indications:    Abnormal ECG 794.31 / R94.31  History:        Patient has no prior history of Echocardiogram examinations.                 Signs/Symptoms:Dyspnea and Shortness of Breath; Risk                 Factors:Current Smoker. Submassive Bilateral Pulmonary Emboli.  Sonographer:    Leta Jungling RDCS Referring Phys: [phone] Estevan Ryder MCINTYRE IMPRESSIONS  1. Left ventricular ejection fraction, by estimation, is 60 to 65%. The left ventricle has normal function. The left ventricle has no regional wall motion abnormalities. Left ventricular diastolic parameters were normal.  2. Right ventricular systolic function is normal. The right ventricular size is normal.  3. The mitral valve is normal in structure. Trivial mitral valve regurgitation.  4. The aortic valve is normal in structure. Aortic valve regurgitation is not visualized.  5. Aortic dilatation noted. There is mild dilatation of the aortic root measuring 40 mm. FINDINGS  Left Ventricle: Left ventricular ejection fraction, by estimation, is 60 to 65%. The left ventricle has normal function. The left ventricle has no regional wall motion abnormalities. The left ventricular internal cavity size was normal in size. There is  no left  ventricular hypertrophy. Left ventricular diastolic parameters were normal. Right Ventricle: The right ventricular size is normal. No increase in right ventricular wall thickness. Right ventricular systolic function is normal. Left Atrium: Left atrial size was normal in size. Right Atrium: Right atrial size was normal in size. Pericardium: There is no evidence of pericardial effusion. Mitral Valve: The mitral valve is normal in structure. Trivial mitral valve regurgitation. Tricuspid Valve: The tricuspid valve is normal in  structure. Tricuspid valve regurgitation is trivial. Aortic Valve: The aortic valve is normal in structure. Aortic valve regurgitation is not visualized. Pulmonic Valve: The pulmonic valve was normal in structure. Pulmonic valve regurgitation is trivial. Aorta: Aortic dilatation noted. There is mild dilatation of the aortic root measuring 40 mm. IAS/Shunts: No atrial level shunt detected by color flow Doppler.  LEFT VENTRICLE PLAX 2D LVIDd:         5.00 cm  Diastology LVIDs:         3.30 cm  LV e' lateral:   9.36 cm/s LV PW:         0.80 cm  LV E/e' lateral: 5.5 LV IVS:        0.90 cm  LV e' medial:    7.72 cm/s LVOT diam:     2.20 cm  LV E/e' medial:  6.6 LV SV:         69 LV SV Index:   30 LVOT Area:     3.80 cm  RIGHT VENTRICLE RV S prime:     12.90 cm/s TAPSE (M-mode): 2.3 cm LEFT ATRIUM             Index       RIGHT ATRIUM           Index LA diam:        3.90 cm 1.71 cm/m  RA Area:     21.10 cm LA Vol (A2C):   65.0 ml 28.45 ml/m RA Volume:   56.40 ml  24.68 ml/m LA Vol (A4C):   50.5 ml 22.10 ml/m LA Biplane Vol: 58.0 ml 25.38 ml/m  AORTIC VALVE LVOT Vmax:   114.00 cm/s LVOT Vmean:  75.200 cm/s LVOT VTI:    0.182 m  AORTA Ao Root diam: 4.00 cm MITRAL VALVE MV Area (PHT): 2.34 cm    SHUNTS MV Decel Time: 324 msec    Systemic VTI:  0.18 m MV E velocity: 51.10 cm/s  Systemic Diam: 2.20 cm MV A velocity: 45.90 cm/s MV E/A ratio:  1.11 Dietrich Pates MD Electronically signed by Dietrich Pates MD Signature Date/Time: 07/30/2019/6:16:27 PM    Final     Hip Xrays 07/2019: IMPRESSION: Advanced left hip osteoarthrosis with severe joint space narrowing.  Mild osteoarthritic changes of the right hip.   Opioid Risk Tool - 09/24/19 0900      Family History of Substance Abuse   Alcohol Negative    Illegal Drugs Negative    Rx Drugs Negative      Personal History of Substance Abuse   Alcohol Negative    Illegal Drugs Negative    Rx Drugs Negative      Age   Age between 16-45 years  No      History of  Preadolescent Sexual Abuse   History of Preadolescent Sexual Abuse Negative or Male      Psychological Disease   Psychological Disease Negative    Depression Negative      Total Score   Opioid Risk Tool Scoring 0    Opioid  Risk Interpretation Low Risk                Assessment & Plan:  I personally reviewed all images and lab data in the [facility] system as well as any outside material available during this office visit and agree with the  radiology impressions.   Pulmonary embolism (HCC) History of pulmonary embolism without cor pulmonale plan to at least give 6 months of therapy at this time.  Onset date was Jul 29 2019  Arthritis of left hip Severe arthritis of the left hip with significant degenerative hip disease needing total hip replacement patient has chronic pain with this.  Patient has a low opioid risk score and has agreed to a pain management contract  We will refill the hydrocodone acetaminophen 5/325 3 times daily as needed we gave him 70 today.  We will check a urine drug screen at this visit as well and he has signed the contract.  I checked the [facility] drug database and the only prescription he has in the prior past has been with orthopedics for a 20 count of medication June 15  He understands that hip replacement is his ultimate treatment plan he wishes to defer this for now I did discuss weight loss as being an important measure  Hx of atrial fibrillation without current medication Atrial fibrillation without recurrence at this time off medication  BMI 28.0-28.9,adult Mild obesity patient given a weight loss plan and this was discussed at this visit   Diagnoses and all orders for this visit:  Pulmonary embolism without acute cor pulmonale, unspecified chronicity, unspecified pulmonary embolism type (HCC)  Arthritis of left hip -     [id number] 11+Oxyco+Alc+Crt-Bund  Hx of atrial fibrillation without current medication  Colon cancer screening -      Fecal occult blood, imunochemical  BMI 28.0-28.9,adult  Other orders -     HYDROcodone-acetaminophen (NORCO/VICODIN) 5-325 MG tablet; Take 1 tablet by mouth 3 (three) times daily as needed for moderate pain. -     Tdap vaccine greater than or equal to 7yo IM  Note this patient has quit smoking

## 2019-09-24 NOTE — Patient Instructions (Addendum)
Refill on pain medication given  Urine drug screen today and signed pain management contract  Stool kit today given for colon cancer screen  Stay on eliquis   See Dr Joya Gaskins 4 months  Focus on weight loss, see below  Get covid vaccine in greater than two weeks after you received the tetanus vaccine given today  COVID-19 Vaccine Information can be found at: ShippingScam.co.uk For questions related to vaccine distribution or appointments, please email vaccine_[email or web address] .[email or web address] or call [phone].     Obesity, Adult Obesity is having too much body fat. Being obese means that your weight is more than what is healthy for you. BMI is a number that explains how much body fat you have. If you have a BMI of 30 or more, you are obese. Obesity is often caused by eating or drinking more calories than your body uses. Changing your lifestyle can help you lose weight. Obesity can cause serious health problems, such as:  Stroke.  Coronary artery disease (CAD).  Type 2 diabetes.  Some types of cancer, including cancers of the colon, breast, uterus, and gallbladder.  Osteoarthritis.  High blood pressure (hypertension).  High cholesterol.  Sleep apnea.  Gallbladder stones.  Infertility problems. What are the causes?  Eating meals each day that are high in calories, sugar, and fat.  Being born with genes that may make you more likely to become obese.  Having a medical condition that causes obesity.  Taking certain medicines.  Sitting a lot (having a sedentary lifestyle).  Not getting enough sleep.  Drinking a lot of drinks that have sugar in them. What increases the risk?  Having a family history of obesity.  Being an [place] American woman.  Being a Hispanic man.  Living in an area with limited access to: ? Romilda Garret, recreation centers, or sidewalks. ? Healthy food choices, such as grocery stores and farmers'  markets. What are the signs or symptoms? The main sign is having too much body fat. How is this treated?  Treatment for this condition often includes changing your lifestyle. Treatment may include: ? Changing your diet. This may include making a healthy meal plan. ? Exercise. This may include activity that causes your heart to beat faster (aerobic exercise) and strength training. Work with your doctor to design a program that works for you. ? Medicine to help you lose weight. This may be used if you are not able to lose 1 pound a week after 6 weeks of healthy eating and more exercise. ? Treating conditions that cause the obesity. ? Surgery. Options may include gastric banding and gastric bypass. This may be done if:  Other treatments have not helped to improve your condition.  You have a BMI of 40 or higher.  You have life-threatening health problems related to obesity. Follow these instructions at home: Eating and drinking   Follow advice from your doctor about what to eat and drink. Your doctor may tell you to: ? Limit fast food, sweets, and processed snack foods. ? Choose low-fat options. For example, choose low-fat milk instead of whole milk. ? Eat 5 or more servings of fruits or vegetables each day. ? Eat at home more often. This gives you more control over what you eat. ? Choose healthy foods when you eat out. ? Learn to read food labels. This will help you learn how much food is in 1 serving. ? Keep low-fat snacks available. ? Avoid drinks that have a lot of sugar in them. These include soda, fruit  juice, iced tea with sugar, and flavored milk.  Drink enough water to keep your pee (urine) pale yellow.  Do not go on fad diets. Physical activity  Exercise often, as told by your doctor. Most adults should get up to 150 minutes of moderate-intensity exercise every week.Ask your doctor: ? What types of exercise are safe for you. ? How often you should exercise.  Warm up and  stretch before being active.  Do slow stretching after being active (cool down).  Rest between times of being active. Lifestyle  Work with your doctor and a food expert (dietitian) to set a weight-loss goal that is best for you.  Limit your screen time.  Find ways to reward yourself that do not involve food.  Do not drink alcohol if: ? Your doctor tells you not to drink. ? You are pregnant, may be pregnant, or are planning to become pregnant.  If you drink alcohol: ? Limit how much you use to:  0-1 drink a day for women.  0-2 drinks a day for men. ? Be aware of how much alcohol is in your drink. In the [place] one drink equals one 12 oz bottle of beer (355 mL), one 5 oz glass of wine (148 mL), or one 1 oz glass of hard liquor (44 mL). General instructions  Keep a weight-loss journal. This can help you keep track of: ? The food that you eat. ? How much exercise you get.  Take over-the-counter and prescription medicines only as told by your doctor.  Take vitamins and supplements only as told by your doctor.  Think about joining a support group.  Keep all follow-up visits as told by your doctor. This is important. Contact a doctor if:  You cannot meet your weight loss goal after you have changed your diet and lifestyle for 6 weeks. Get help right away if you:  Are having trouble breathing.  Are having thoughts of harming yourself. Summary  Obesity is having too much body fat.  Being obese means that your weight is more than what is healthy for you.  Work with your doctor to set a weight-loss goal.  Get regular exercise as told by your doctor. This information is not intended to replace advice given to you by your health care provider. Make sure you discuss any questions you have with your health care provider. Document Revised: 11/14/2017 Document Reviewed: 11/14/2017 Elsevier Patient Education  2020 [employer].

## 2019-09-24 NOTE — Assessment & Plan Note (Signed)
Atrial fibrillation without recurrence at this time off medication

## 2019-09-24 NOTE — Assessment & Plan Note (Signed)
History of pulmonary embolism without cor pulmonale plan to at least give 6 months of therapy at this time.  Onset date was Jul 29 2019

## 2019-09-24 NOTE — Assessment & Plan Note (Signed)
Severe arthritis of the left hip with significant degenerative hip disease needing total hip replacement patient has chronic pain with this.  Patient has a low opioid risk score and has agreed to a pain management contract  We will refill the hydrocodone acetaminophen 5/325 3 times daily as needed we gave him 70 today.  We will check a urine drug screen at this visit as well and he has signed the contract.  I checked the [facility] drug database and the only prescription he has in the prior past has been with orthopedics for a 20 count of medication June 15  He understands that hip replacement is his ultimate treatment plan he wishes to defer this for now I did discuss weight loss as being an important measure

## 2019-09-24 NOTE — Assessment & Plan Note (Signed)
Mild obesity patient given a weight loss plan and this was discussed at this visit

## 2019-09-24 NOTE — Progress Notes (Signed)
F/u

## 2019-09-25 MED FILL — HYDROCODON-APAP 5-325: 5-325 | 23 days supply | Qty: 70 | Fill #0

## 2019-10-02 ENCOUNTER — Telehealth: Payer: Self-pay | Admitting: Critical Care Medicine

## 2019-10-02 NOTE — Telephone Encounter (Signed)
Please Advise.  Copied from [place] [id number]. Topic: General - Other >> Oct 02, 2019 10:13 AM Yvette Rack wrote: Reason for CRM: Pt stated he needs another colonoscopy kit because the amount of time he had to turn in his specimen has expired. Pt requests call back regarding getting another kit

## 2019-10-02 NOTE — Telephone Encounter (Signed)
Called pt will come Monday to pick up a new kit .

## 2019-10-15 MED FILL — $ELIQUIS 5 MG TABLET: 5 | 90 days supply | Qty: 180 | Fill #2

## 2020-01-21 MED FILL — $ELIQUIS 5 MG TABLET: 5 | 60 days supply | Qty: 120 | Fill #3

## 2020-02-04 MED FILL — $ELIQUIS 5 MG TABLET: 5 | 30 days supply | Qty: 60 | Fill #3

## 2020-02-20 NOTE — Progress Notes (Signed)
Subjective:    Patient ID: Patrick Warner, male    DOB: Mar 23, 1956, 64 y.o.   MRN: [id number]  08/06/19 64 y.o.M  HFU for submassive PE  This patient was admitted between the fifth and 8 May for submassive pulmonary emboli.  Patient comes in today for post hospital follow-up visit.  Below is the discharge summary  Dcsummary:   Submassive Pulmonary Emboli Patient presented to the ED with SOB, was treated with supplental oxygen and underwent CXR with concern for CAP. He was treated with azithro and ctx for one time dosing but given that he was afebrile and without leukocytosis and CTA suggested pulmonary findings were potentially due to infarction from emboli, these antibiotics were not continued as patient was thought to not have pneumonia. Patient did report new hemoptysis in addition to his chronic smoker's cough. EKG on admission showed right bundle branch block and no atrial fibrillation nor signs of ischemia with troponin levels that were unremarkable at 14 and 14. CTA was completed and showed Extensive bilateral lobar, segmental, and subsegmental pulmonary emboli within the bilateral lower lobe pulmonary arteries with involvement of a segmental branchartery of the right upper lobe. CTA had RV to LV ratio of 1.0, usually seen with right heart strain as well as patchy consolidation and ground-glass opacity within the right lower lobe may have represented pneumonia versus pulmonary infarction.The patient was started on heparin drip and on 5/6, was transitioned to Eliquis. Patient had no PCP on admission so TOC was conuslted to assist in getting the patient [facility]. Patient was scheduled for follow up at the time of discharge. Echocardiogram was completed and showed LVEF 60-65% with some aortic root dilation. Patient was also evaluated by physical therapy while admitted with no follow up recommendations. He did not require supplemental oxygen  upon discharge.   The patient states since discharge he is no longer drinking alcohol and is not currently smoking.  He states his breathing is improved and he is having no active complaints.  He does complain of some left chronic hip pain.  The patient is maintaining Eliquis and will need patient assistance for this.  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  The patient has great deal of difficulty with any type of mobilization and is seen orthopedics and they recommended a hip replacement he wishes to defer this for now.  He is on 5 mg of hydrocodone and acetaminophen 3 times daily with some relief.  He has run out of his 20 pills he was given on 15 June. Patient has no prior history of drug use substance use alcohol use.  No psychiatric history.  No strong positive family history.  His opioid use risk is low based on the opioid risk scale  Patient maintains apixaban twice daily doing well with this without bleeding.  The patient has not gone back into atrial fibrillation since his last visit.  The patient maintains a good blood pressure and good pulse rate at this time.  Note metabolic panel CBC  were normal hepatitis C negative at last visit.  Patient received a cortisone injection left hip with minimal relief.  He is considering the hip replacement wishes to hold off at this time.  He does have an elevated BMI and we did discuss need for some weight loss.  The patient is yet copy of Covid vaccine and he is still pondering this.  He does know he  needs a Tdap at this visit.  Also needs colon cancer screening.  11/29 Patient is seen in follow-up with history of pulmonary embolism atrial fibrillation on apixaban for life 5 mg twice daily  Also severe arthritis of the left hip with chronic pain management.  The patient's been reluctant to undergo hip replacement for now he is giving it more consideration as the pain medicine is not holding his symptoms. On arrival this patient's blood pressure is 143/89  Patient is currently not smoking  Past Medical History:  Diagnosis Date  . Abnormal heart rhythm    "fibrillation of heart", diagnosed 4 years ago     Family History  Problem Relation Age of Onset  . Diabetes Mother      Social History   Socioeconomic History  . Marital status: Single    Spouse name: Not on file  . Number of children: Not on file  . Years of education: Not on file  . Highest education level: Not on file  Occupational History  . Not on file  Tobacco Use  . Smoking status: Former Smoker    Packs/day: 0.50    Years: 45.00    Pack years: 22.50    Types: Cigarettes, Cigars    Quit date: 07/30/2019    Years since quitting: 0.5  . Smokeless tobacco: Never Used  Substance and Sexual Activity  . Alcohol use: Yes    Alcohol/week: 12.0 standard drinks    Types: 12 Cans of beer per week    Comment: 4 drinks per day on weekends   . Drug use: Never  . Sexual activity: Not on file  Other Topics Concern  . Not on file  Social History Narrative  . Not on file   Social Determinants of Health   Financial Resource Strain:   . Difficulty of Paying Living Expenses: Not on file  Food Insecurity:   . Worried About Programme researcher, broadcasting/film/video in the Last Year: Not on file  . Ran Out of Food in the Last Year: Not on file  Transportation Needs:   . Lack of Transportation (Medical): Not on file  . Lack of Transportation (Non-Medical): Not on file  Physical Activity:   . Days of Exercise per Week: Not on file  . Minutes  of Exercise per Session: Not on file  Stress:   . Feeling of Stress : Not on file  Social Connections:   . Frequency of Communication with Friends and Family: Not on file  . Frequency of Social Gatherings with Friends and Family: Not on file  . Attends Religious Services: Not on file  . Active Member of Clubs or Organizations: Not on file  . Attends Banker Meetings: Not on file  . Marital Status: Not on file  Intimate Partner Violence:   . Fear of Current or Ex-Partner: Not on file  . Emotionally Abused: Not on file  . Physically Abused: Not on file  . Sexually Abused: Not on file     No Known Allergies   Outpatient Medications Prior to Visit  Medication Sig Dispense  Refill  . apixaban (ELIQUIS) 5 MG TABS tablet Take 1 tablet (5 mg total) by mouth 2 (two) times daily. Start when starter pak runs out 60 tablet 6  . HYDROcodone-acetaminophen (NORCO/VICODIN) 5-325 MG tablet Take 1 tablet by mouth 3 (three) times daily as needed for moderate pain. 70 tablet 0   No facility-administered medications prior to visit.       Review of Systems  Constitutional: Negative.   HENT: Negative.   Eyes: Negative.   Respiratory: Negative.   Cardiovascular: Negative.   Gastrointestinal: Negative.   Genitourinary: Negative.   Musculoskeletal: Negative.   Neurological: Negative.   Hematological: Does not bruise/bleed easily.  Psychiatric/Behavioral: Negative.        Objective:   Physical Exam Vitals:   02/22/20 1009  BP: (!) 143/89  Pulse: 68  SpO2: 100%  Weight: 240 lb 3.2 oz (109 kg)    Gen: Pleasant, mild obesity in no distress,  normal affect  ENT: No lesions,  mouth clear,  oropharynx clear, no postnasal drip  Neck: No JVD, no TMG, no carotid bruits  Lungs: No use of accessory muscles, no dullness to percussion, clear without rales or rhonchi  Cardiovascular: RRR, heart sounds normal, no murmur or gallops, no peripheral edema  Abdomen: soft and NT, no HSM,   BS normal  Musculoskeletal: No deformities, no cyanosis or clubbing  Neuro: alert, non focal  Skin: Warm, no lesions or rashes  PHQ 9 and gad 7 are normal  Significant Labs and Imaging:  Last Labs  Recent Labs Lab 07/29/19 0858 07/30/19 0319 07/31/19 0337 WBC 11.4* 10.7* 8.0 HGB 14.2 12.4* 12.3* HCT 45.6 40.0 39.0 PLT 239 210 261   Last Labs  Recent Labs Lab 07/29/19 0858 07/29/19 0858 07/30/19 0319 07/31/19 0337 NA 137  --  137 136 K 3.6   < > 3.8 3.5 CL 100  --  104 104 CO2 24  --  23 24 GLUCOSE 120*  --  100* 93 BUN 6*  --  7* 8 CREATININE 1.07  --  1.00 1.00 CALCIUM 10.1  --  9.3 9.2 ALKPHOS  --   --  86 90 AST  --   --  59* 54* ALT  --   --  47* 52* ALBUMIN  --   --  2.5* 2.3*  < > = values in this interval not displayed.    DG Chest 2 View  Result Date: 07/29/2019 CLINICAL DATA:  Shortness of breath EXAM: CHEST - 2 VIEW COMPARISON:  None. FINDINGS: There is atelectatic change bilaterally with small left pleural effusion. There is ill-defined opacity in the right mid and lower lung regions. Heart size and pulmonary vascularity are normal. No adenopathy. There is degenerative change in the thoracic spine. IMPRESSION: Ill-defined airspace opacity in the right mid and lower lung regions consistent pneumonia involving several lung segments. Bibasilar atelectasis with small left pleural effusion. Cardiac silhouette normal.  No adenopathy. These findings may warrant correlation with COVID-19 status. Electronically Signed   By: Bretta Bang III M.D.   On: 07/29/2019 09:36   CT Angio Chest PE W/Cm &/Or Wo Cm  Result Date: 07/29/2019 CLINICAL DATA:  Shortness of breath EXAM: CT ANGIOGRAPHY CHEST WITH CONTRAST TECHNIQUE: Multidetector CT imaging of the chest was performed using the standard protocol during bolus administration of intravenous contrast. Multiplanar CT image reconstructions and MIPs were obtained to evaluate the vascular anatomy. CONTRAST:   OMNIPAQUE IOHEXOL 350 MG/ML SOLN COMPARISON:  X-ray 07/29/2019 FINDINGS: Cardiovascular: Prominent  thrombus within the lobar, segmental, and subsegmental branches of the right lower lobe (series 6, image 68). Additional thrombi within segmental and subsegmental branch pulmonary arteries of the left lower lobe (series 6, image 74). Segmental branch right upper lobe filling defect (series 6, image 57). No left upper lobe pulmonary arterial filling defects. No saddle embolism. RV to LV ratio of 1.0 with flattening of the interventricular septum. Heart size within normal limits. No pericardial effusion. Thoracic aorta is nonaneurysmal. Scattered coronary artery calcification. Mediastinum/Nodes: No enlarged mediastinal, hilar, or axillary lymph nodes. Thyroid gland, trachea, and esophagus demonstrate no significant findings. Lungs/Pleura: Patchy consolidation and ground-glass opacity within the right lower lobe. Extensive bibasilar atelectasis. No pleural effusion or pneumothorax. Upper Abdomen: No acute findings. Musculoskeletal: No chest wall abnormality. No acute or significant osseous findings. Review of the MIP images confirms the above findings. IMPRESSION: 1. Extensive bilateral lobar, segmental, and subsegmental pulmonary emboli within the bilateral lower lobe pulmonary arteries, right worse than left. There is also involvement of a segmental branch artery of the right upper lobe. 2. RV to LV ratio of 1.0, which can be seen with right heart strain. 3. Patchy consolidation and ground-glass opacity within the right lower lobe may represent pneumonia versus pulmonary infarction. 4. Extensive bibasilar atelectasis. These results were called by telephone at the time of interpretation on 07/29/2019 at 12:58 pm to provider Lorre NickANTHONY ALLEN , who verbally acknowledged these results. Electronically Signed   By: Duanne GuessNicholas  Plundo D.O.   On: 07/29/2019 13:00   ECHOCARDIOGRAM COMPLETE  Result Date: 07/30/2019     ECHOCARDIOGRAM REPORT   Patient Name:   Talmage CoinSHERRILL WAYNE Blanks Date of Exam: 07/30/2019 Medical Rec #:  [id number]            Height:       74.0 in Accession #:    [id number]           Weight:       225.0 lb Date of Birth:  1955/11/09            BSA:          2.285 m Patient Age:    64 years             BP:           126/77 mmHg Patient Gender: M                    HR:           78 bpm. Exam Location:  Inpatient Procedure: 2D Echo Indications:    Abnormal ECG 794.31 / R94.31  History:        Patient has no prior history of Echocardiogram examinations.                 Signs/Symptoms:Dyspnea and Shortness of Breath; Risk                 Factors:Current Smoker. Submassive Bilateral Pulmonary Emboli.  Sonographer:    Leta Junglingiffany Cooper RDCS Referring Phys: [phone] [id number] Estevan RyderBRITTANY J MCINTYRE IMPRESSIONS  1. Left ventricular ejection fraction, by estimation, is 60 to 65%. The left ventricle has normal function. The left ventricle has no regional wall motion abnormalities. Left ventricular diastolic parameters were normal.  2. Right ventricular systolic function is normal. The right ventricular size is normal.  3. The mitral valve is normal in structure. Trivial mitral valve regurgitation.  4. The aortic valve is normal in structure. Aortic valve regurgitation is not visualized.  5. Aortic  dilatation noted. There is mild dilatation of the aortic root measuring 40 mm. FINDINGS  Left Ventricle: Left ventricular ejection fraction, by estimation, is 60 to 65%. The left ventricle has normal function. The left ventricle has no regional wall motion abnormalities. The left ventricular internal cavity size was normal in size. There is  no left ventricular hypertrophy. Left ventricular diastolic parameters were normal. Right Ventricle: The right ventricular size is normal. No increase in right ventricular wall thickness. Right ventricular systolic function is normal. Left Atrium: Left atrial size was normal in size. Right Atrium: Right atrial size was  normal in size. Pericardium: There is no evidence of pericardial effusion. Mitral Valve: The mitral valve is normal in structure. Trivial mitral valve regurgitation. Tricuspid Valve: The tricuspid valve is normal in structure. Tricuspid valve regurgitation is trivial. Aortic Valve: The aortic valve is normal in structure. Aortic valve regurgitation is not visualized. Pulmonic Valve: The pulmonic valve was normal in structure. Pulmonic valve regurgitation is trivial. Aorta: Aortic dilatation noted. There is mild dilatation of the aortic root measuring 40 mm. IAS/Shunts: No atrial level shunt detected by color flow Doppler.  LEFT VENTRICLE PLAX 2D LVIDd:         5.00 cm  Diastology LVIDs:         3.30 cm  LV e' lateral:   9.36 cm/s LV PW:         0.80 cm  LV E/e' lateral: 5.5 LV IVS:        0.90 cm  LV e' medial:    7.72 cm/s LVOT diam:     2.20 cm  LV E/e' medial:  6.6 LV SV:         69 LV SV Index:   30 LVOT Area:     3.80 cm  RIGHT VENTRICLE RV S prime:     12.90 cm/s TAPSE (M-mode): 2.3 cm LEFT ATRIUM             Index       RIGHT ATRIUM           Index LA diam:        3.90 cm 1.71 cm/m  RA Area:     21.10 cm LA Vol (A2C):   65.0 ml 28.45 ml/m RA Volume:   56.40 ml  24.68 ml/m LA Vol (A4C):   50.5 ml 22.10 ml/m LA Biplane Vol: 58.0 ml 25.38 ml/m  AORTIC VALVE LVOT Vmax:   114.00 cm/s LVOT Vmean:  75.200 cm/s LVOT VTI:    0.182 m  AORTA Ao Root diam: 4.00 cm MITRAL VALVE MV Area (PHT): 2.34 cm    SHUNTS MV Decel Time: 324 msec    Systemic VTI:  0.18 m MV E velocity: 51.10 cm/s  Systemic Diam: 2.20 cm MV A velocity: 45.90 cm/s MV E/A ratio:  1.11 Dietrich Pates MD Electronically signed by Dietrich Pates MD Signature Date/Time: 07/30/2019/6:16:27 PM    Final     Hip Xrays 07/2019: IMPRESSION: Advanced left hip osteoarthrosis with severe joint space narrowing.  Mild osteoarthritic changes of the right hip.         Assessment & Plan:  I personally reviewed all images and lab data in the [facility] system as  well as any outside material available during this office visit and agree with the  radiology impressions.   Pulmonary embolism (HCC) Plan lifelong anticoagulation especially with the patient's atrial fibrillation status  Arthritis of left hip Refill pain medications refer back to orthopedics  Chronic anticoagulation  Check CBC continue apixaban 5 mg twice daily  Chronic pain syndrome Repeat urine drug screen continue Norco 5/320 513 times a day as needed for pain  [facility] drug database checked and was negative for other prescribers   [place] was seen today for follow-up.  Diagnoses and all orders for this visit:  Pulmonary embolism without acute cor pulmonale, unspecified chronicity, unspecified pulmonary embolism type [facility])  Colon cancer screening -     Fecal occult blood, imunochemical  Chronic anticoagulation -     CBC with Differential/Platelet  Chronic pain syndrome -     [id number] 11+Oxyco+Alc+Crt-Bund  Arthritis of left hip -     Ambulatory referral to Orthopedic Surgery  Need for immunization against influenza -     Flu Vaccine QUAD 36+ mos IM  Hx of atrial fibrillation without current medication  Other orders -     methocarbamol (ROBAXIN) 500 MG tablet; Take 1 tablet (500 mg total) by mouth every 6 (six) hours as needed for muscle spasms. -     apixaban (ELIQUIS) 5 MG TABS tablet; Take 1 tablet (5 mg total) by mouth 2 (two) times daily. -     HYDROcodone-acetaminophen (NORCO/VICODIN) 5-325 MG tablet; Take 1 tablet by mouth 3 (three) times daily as needed for moderate pain.

## 2020-02-22 ENCOUNTER — Encounter: Payer: Self-pay | Admitting: Critical Care Medicine

## 2020-02-22 ENCOUNTER — Other Ambulatory Visit: Payer: Self-pay | Admitting: Critical Care Medicine

## 2020-02-22 ENCOUNTER — Ambulatory Visit: Payer: Self-pay | Attending: Critical Care Medicine | Admitting: Critical Care Medicine

## 2020-02-22 ENCOUNTER — Other Ambulatory Visit: Payer: Self-pay

## 2020-02-22 VITALS — BP 143/89 | HR 68 | Wt 240.2 lb

## 2020-02-22 DIAGNOSIS — Z87891 Personal history of nicotine dependence: Secondary | ICD-10-CM | POA: Insufficient documentation

## 2020-02-22 DIAGNOSIS — Z1211 Encounter for screening for malignant neoplasm of colon: Secondary | ICD-10-CM | POA: Insufficient documentation

## 2020-02-22 DIAGNOSIS — Z7901 Long term (current) use of anticoagulants: Secondary | ICD-10-CM | POA: Insufficient documentation

## 2020-02-22 DIAGNOSIS — I4891 Unspecified atrial fibrillation: Secondary | ICD-10-CM | POA: Insufficient documentation

## 2020-02-22 DIAGNOSIS — M160 Bilateral primary osteoarthritis of hip: Secondary | ICD-10-CM | POA: Insufficient documentation

## 2020-02-22 DIAGNOSIS — R195 Other fecal abnormalities: Secondary | ICD-10-CM | POA: Insufficient documentation

## 2020-02-22 DIAGNOSIS — I2699 Other pulmonary embolism without acute cor pulmonale: Secondary | ICD-10-CM

## 2020-02-22 DIAGNOSIS — Z23 Encounter for immunization: Secondary | ICD-10-CM | POA: Insufficient documentation

## 2020-02-22 DIAGNOSIS — Z8679 Personal history of other diseases of the circulatory system: Secondary | ICD-10-CM

## 2020-02-22 DIAGNOSIS — G894 Chronic pain syndrome: Secondary | ICD-10-CM | POA: Insufficient documentation

## 2020-02-22 DIAGNOSIS — I2693 Single subsegmental pulmonary embolism without acute cor pulmonale: Secondary | ICD-10-CM | POA: Insufficient documentation

## 2020-02-22 DIAGNOSIS — I4510 Unspecified right bundle-branch block: Secondary | ICD-10-CM | POA: Insufficient documentation

## 2020-02-22 DIAGNOSIS — M1612 Unilateral primary osteoarthritis, left hip: Secondary | ICD-10-CM

## 2020-02-22 MED ORDER — APIXABAN 5 MG PO TABS: 5.0000 mg | ORAL_TABLET | Freq: Two times a day (BID) | ORAL | 6 refills | Status: DC

## 2020-02-22 MED ORDER — METHOCARBAMOL 500 MG PO TABS: 500.0000 mg | ORAL_TABLET | Freq: Four times a day (QID) | ORAL | 1 refills | Status: DC | PRN

## 2020-02-22 MED ORDER — HYDROCODONE-ACETAMINOPHEN 5-325 MG PO TABS: 1.0000 | ORAL_TABLET | Freq: Three times a day (TID) | ORAL | 0 refills | Status: DC | PRN

## 2020-02-22 MED FILL — HYDROCODON-APAP 5-325: 5-325 | 23 days supply | Qty: 70 | Fill #0

## 2020-02-22 MED FILL — METHOCARBAMOL 500 MG TABS: 500 | 15 days supply | Qty: 60 | Fill #0

## 2020-02-22 NOTE — Progress Notes (Signed)
Pt needs refill on Hydrocodone stated has been taking as needed

## 2020-02-22 NOTE — Assessment & Plan Note (Signed)
Refill pain medications refer back to orthopedics

## 2020-02-22 NOTE — Assessment & Plan Note (Signed)
Repeat urine drug screen continue Norco 5/320 513 times a day as needed for pain  [address] drug database checked and was negative for other prescribers

## 2020-02-22 NOTE — Patient Instructions (Signed)
A referral to orthopedics will be made for your hip  Methocarbamol 1 every 6 hours as needed for muscle spasm was sent to the our community [facility] pharmacy  Refill on your pain medicine sent to the [facility] outpatient pharmacy  Today obtain a urine drug screen for your pain contract, blood counts since you are on chronic blood thinners  All other medications were sent to our pharmacy [facility] wellness  Flu vaccine was given today  Your Covid booster will not be due until the first week of April 2022  Follow a healthy diet to lose weight and reduce your salt intake, see below  Return to see Dr. Delford Field in 4 months   Obesity, Adult Obesity is having too much body fat. Being obese means that your weight is more than what is healthy for you. BMI is a number that explains how much body fat you have. If you have a BMI of 30 or more, you are obese. Obesity is often caused by eating or drinking more calories than your body uses. Changing your lifestyle can help you lose weight. Obesity can cause serious health problems, such as:  Stroke.  Coronary artery disease (CAD).  Type 2 diabetes.  Some types of cancer, including cancers of the colon, breast, uterus, and gallbladder.  Osteoarthritis.  High blood pressure (hypertension).  High cholesterol.  Sleep apnea.  Gallbladder stones.  Infertility problems. What are the causes?  Eating meals each day that are high in calories, sugar, and fat.  Being born with genes that may make you more likely to become obese.  Having a medical condition that causes obesity.  Taking certain medicines.  Sitting a lot (having a sedentary lifestyle).  Not getting enough sleep.  Drinking a lot of drinks that have sugar in them. What increases the risk?  Having a family history of obesity.  Being an [place] American woman.  Being a Hispanic man.  Living in an area with limited access to: ? [place] [facility], recreation centers, or  sidewalks. ? Healthy food choices, such as grocery stores and farmers' markets. What are the signs or symptoms? The main sign is having too much body fat. How is this treated?  Treatment for this condition often includes changing your lifestyle. Treatment may include: ? Changing your diet. This may include making a healthy meal plan. ? Exercise. This may include activity that causes your heart to beat faster (aerobic exercise) and strength training. Work with your doctor to design a program that works for you. ? Medicine to help you lose weight. This may be used if you are not able to lose 1 pound a week after 6 weeks of healthy eating and more exercise. ? Treating conditions that cause the obesity. ? Surgery. Options may include gastric banding and gastric bypass. This may be done if:  Other treatments have not helped to improve your condition.  You have a BMI of 40 or higher.  You have life-threatening health problems related to obesity. Follow these instructions at home: Eating and drinking   Follow advice from your doctor about what to eat and drink. Your doctor may tell you to: ? Limit fast food, sweets, and processed snack foods. ? Choose low-fat options. For example, choose low-fat milk instead of whole milk. ? Eat 5 or more servings of fruits or vegetables each day. ? Eat at home more often. This gives you more control over what you eat. ? Choose healthy foods when you eat out. ? Learn to  read food labels. This will help you learn how much food is in 1 serving. ? Keep low-fat snacks available. ? Avoid drinks that have a lot of sugar in them. These include soda, fruit juice, iced tea with sugar, and flavored milk.  Drink enough water to keep your pee (urine) pale yellow.  Do not go on fad diets. Physical activity  Exercise often, as told by your doctor. Most adults should get up to 150 minutes of moderate-intensity exercise every week.Ask your doctor: ? What types of  exercise are safe for you. ? How often you should exercise.  Warm up and stretch before being active.  Do slow stretching after being active (cool down).  Rest between times of being active. Lifestyle  Work with your doctor and a food expert (dietitian) to set a weight-loss goal that is best for you.  Limit your screen time.  Find ways to reward yourself that do not involve food.  Do not drink alcohol if: ? Your doctor tells you not to drink. ? You are pregnant, may be pregnant, or are planning to become pregnant.  If you drink alcohol: ? Limit how much you use to:  0-1 drink a day for women.  0-2 drinks a day for men. ? Be aware of how much alcohol is in your drink. In the [place] one drink equals one 12 oz bottle of beer (355 mL), one 5 oz glass of wine (148 mL), or one 1 oz glass of hard liquor (44 mL). General instructions  Keep a weight-loss journal. This can help you keep track of: ? The food that you eat. ? How much exercise you get.  Take over-the-counter and prescription medicines only as told by your doctor.  Take vitamins and supplements only as told by your doctor.  Think about joining a support group.  Keep all follow-up visits as told by your doctor. This is important. Contact a doctor if:  You cannot meet your weight loss goal after you have changed your diet and lifestyle for 6 weeks. Get help right away if you:  Are having trouble breathing.  Are having thoughts of harming yourself. Summary  Obesity is having too much body fat.  Being obese means that your weight is more than what is healthy for you.  Work with your doctor to set a weight-loss goal.  Get regular exercise as told by your doctor. This information is not intended to replace advice given to you by your health care provider. Make sure you discuss any questions you have with your health care provider. Document Revised: 11/14/2017 Document Reviewed: 11/14/2017 Elsevier Patient  Education  2020 [employer].

## 2020-02-22 NOTE — Assessment & Plan Note (Signed)
Check CBC continue apixaban 5 mg twice daily

## 2020-02-22 NOTE — Assessment & Plan Note (Signed)
Plan lifelong anticoagulation especially with the patient's atrial fibrillation status

## 2020-02-23 LAB — CBC WITH DIFFERENTIAL/PLATELET
Basophils Absolute: 0.1 10*3/uL (ref 0.0–0.2)
Basos: 1 %
EOS (ABSOLUTE): 0.1 10*3/uL (ref 0.0–0.4)
Eos: 3 %
Hematocrit: 44.1 % (ref 37.5–51.0)
Hemoglobin: 14.5 g/dL (ref 13.0–17.7)
Immature Grans (Abs): 0 10*3/uL (ref 0.0–0.1)
Immature Granulocytes: 0 %
Lymphocytes Absolute: 1.7 10*3/uL (ref 0.7–3.1)
Lymphs: 40 %
MCH: 28.6 pg (ref 26.6–33.0)
MCHC: 32.9 g/dL (ref 31.5–35.7)
MCV: 87 fL (ref 79–97)
Monocytes Absolute: 0.6 10*3/uL (ref 0.1–0.9)
Monocytes: 14 %
Neutrophils Absolute: 1.8 10*3/uL (ref 1.4–7.0)
Neutrophils: 42 %
Platelets: 220 10*3/uL (ref 150–450)
RBC: 5.07 x10E6/uL (ref 4.14–5.80)
RDW: 14.6 % (ref 11.6–15.4)
WBC: 4.3 10*3/uL (ref 3.4–10.8)

## 2020-03-03 ENCOUNTER — Ambulatory Visit (INDEPENDENT_AMBULATORY_CARE_PROVIDER_SITE_OTHER): Payer: Self-pay | Admitting: Orthopaedic Surgery

## 2020-03-03 DIAGNOSIS — M1612 Unilateral primary osteoarthritis, left hip: Secondary | ICD-10-CM

## 2020-03-03 LAB — DRUG SCREEN 764883 11+OXYCO+ALC+CRT-BUND
Amphetamines, Urine: NEGATIVE ng/mL
BENZODIAZ UR QL: NEGATIVE ng/mL
Barbiturate: NEGATIVE ng/mL
Cocaine (Metabolite): NEGATIVE ng/mL
Creatinine: 126 mg/dL (ref 20.0–300.0)
Ethanol: NEGATIVE %
Meperidine: NEGATIVE ng/mL
Methadone Screen, Urine: NEGATIVE ng/mL
Oxycodone/Oxymorphone, Urine: NEGATIVE ng/mL
Phencyclidine: NEGATIVE ng/mL
Propoxyphene: NEGATIVE ng/mL
Tramadol: NEGATIVE ng/mL
pH, Urine: 5.1 (ref 4.5–8.9)

## 2020-03-03 LAB — CANNABINOID CONFIRMATION, UR
CANNABINOIDS: POSITIVE — AB
Carboxy THC GC/MS Conf: 281 ng/mL

## 2020-03-03 LAB — OPIATES CONFIRMATION, URINE: Opiates: NEGATIVE ng/mL

## 2020-03-03 NOTE — Progress Notes (Signed)
Office Visit Note   Patient: Patrick Warner           Date of Birth: [birth year]           MRN: [id number] Visit Date: 03/03/2020              Requested by: Storm Frisk, MD [address] [place],  [place] [address] PCP: Storm Frisk, MD   Assessment & Plan: Visit Diagnoses:  1. Primary osteoarthritis of left hip     Plan: Impression is end-stage left hip DJD.  X-rays reviewed with the patient in detail.  We had a lengthy discussion on treatment options to include nonsurgical versus surgical and their risks and benefits and alternatives.  He feels that he will likely opt for a total hip replacement in the future but currently he has some legal matters that he needs to sort out first.  For now he will continue to take over-the-counter medications as well as prescribed pain medications by his PCP.  We will see him back as needed.  Follow-Up Instructions: Return if symptoms worsen or fail to improve.   Orders:  No orders of the defined types were placed in this encounter.  No orders of the defined types were placed in this encounter.     Procedures: No procedures performed   Clinical Data: No additional findings.   Subjective: Chief Complaint  Patient presents with  . Left Hip - Pain    Patrick Warner is a 64 year old gentleman here for chronic left hip pain.  He has had severe pain for about a year.  Denies any injuries.  He feels pain that radiates to the buttock area.  He endorses a limp.  He is retired.  He has questions about cortisone injections.  He has received a couple of hip injections by Dr. Prince Warner with temporary relief.  He currently takes hydrocodone and Robaxin prescribed by his PCP.  He takes Eliquis for history of PE.  Denies any radicular symptoms.  Does not endorse really any groin pain.   Review of Systems  Constitutional: Negative.   All other systems reviewed and are negative.    Objective: Vital Signs: There were no vitals taken for  this visit.  Physical Exam Vitals and nursing note reviewed.  Constitutional:      Appearance: He is well-developed and well-nourished.  HENT:     Head: Normocephalic and atraumatic.  Eyes:     Pupils: Pupils are equal, round, and reactive to light.  Pulmonary:     Effort: Pulmonary effort is normal.  Abdominal:     Palpations: Abdomen is soft.  Musculoskeletal:        General: Normal range of motion.     Cervical back: Neck supple.  Skin:    General: Skin is warm.  Neurological:     Mental Status: He is alert and oriented to person, place, and time.  Psychiatric:        Mood and Affect: Mood and affect normal.        Behavior: Behavior normal.        Thought Content: Thought content normal.        Judgment: Judgment normal.     Ortho Exam Left hip exam shows a significant pain with attempted range of motion.  Antalgic gait.  Pain with straight leg raise.  No sciatic tension signs.  Lateral hip is nontender. Specialty Comments:  No specialty comments available.  Imaging: No results found.   PMFS  History: Patient Active Problem List   Diagnosis Date Noted  . Chronic pain syndrome 02/22/2020  . Chronic anticoagulation 02/22/2020  . Arthritis of left hip 09/24/2019  . BMI 28.0-28.9,adult 09/24/2019  . Pulmonary embolism (HCC) 07/29/2019  . Hx of atrial fibrillation without current medication    Past Medical History:  Diagnosis Date  . Abnormal heart rhythm    "fibrillation of heart", diagnosed 4 years ago    Family History  Problem Relation Age of Onset  . Diabetes Mother     Past Surgical History:  Procedure Laterality Date  . CARDIOVERSION     around 2017   Social History   Occupational History  . Not on file  Tobacco Use  . Smoking status: Former Smoker    Packs/day: 0.50    Years: 45.00    Pack years: 22.50    Types: Cigarettes, Cigars    Quit date: 07/30/2019    Years since quitting: 0.5  . Smokeless tobacco: Never Used  Substance and Sexual  Activity  . Alcohol use: Yes    Alcohol/week: 12.0 standard drinks    Types: 12 Cans of beer per week    Comment: 4 drinks per day on weekends   . Drug use: Never  . Sexual activity: Not on file

## 2020-03-16 MED FILL — $ELIQUIS 5 MG TABLET: 5 | 30 days supply | Qty: 60 | Fill #4

## 2020-04-26 MED FILL — $ELIQUIS 5 MG TABLET: 5 | 30 days supply | Qty: 60 | Fill #0

## 2020-05-30 MED FILL — $ELIQUIS 5 MG TABLET: 5 | 30 days supply | Qty: 60 | Fill #0

## 2020-06-26 ENCOUNTER — Other Ambulatory Visit: Payer: Self-pay | Admitting: Pharmacist

## 2020-06-26 ENCOUNTER — Other Ambulatory Visit: Payer: Self-pay

## 2020-06-26 MED ORDER — APIXABAN 5 MG PO TABS
5.0000 mg | ORAL_TABLET | Freq: Two times a day (BID) | ORAL | 0 refills | Status: DC
  Filled 2020-06-26: qty 60, 30d supply, fill #0

## 2020-06-27 ENCOUNTER — Other Ambulatory Visit: Payer: Self-pay

## 2020-06-28 ENCOUNTER — Other Ambulatory Visit: Payer: Self-pay

## 2020-07-04 NOTE — Progress Notes (Signed)
Subjective:    Patient ID: Patrick Warner, male    DOB: Jun 27, 1955, 65 y.o.   MRN: [id number]  08/06/19 64 y.o.M  HFU for submassive PE  This patient was admitted between the fifth and 8 May for submassive pulmonary emboli.  Patient comes in today for post hospital follow-up visit.  Below is the discharge summary  Dcsummary:   Submassive Pulmonary Emboli Patient presented to the ED with SOB, was treated with supplental oxygen and underwent CXR with concern for CAP. He was treated with azithro and ctx for one time dosing but given that he was afebrile and without leukocytosis and CTA suggested pulmonary findings were potentially due to infarction from emboli, these antibiotics were not continued as patient was thought to not have pneumonia. Patient did report new hemoptysis in addition to his chronic smoker's cough. EKG on admission showed right bundle branch block and no atrial fibrillation nor signs of ischemia with troponin levels that were unremarkable at 14 and 14. CTA was completed and showed Extensive bilateral lobar, segmental, and subsegmental pulmonary emboli within the bilateral lower lobe pulmonary arteries with involvement of a segmental branchartery of the right upper lobe. CTA had RV to LV ratio of 1.0, usually seen with right heart strain as well as patchy consolidation and ground-glass opacity within the right lower lobe may have represented pneumonia versus pulmonary infarction.The patient was started on heparin drip and on 5/6, was transitioned to Eliquis. Patient had no PCP on admission so TOC was conuslted to assist in getting the patient established with the community [facility] wellness center. Patient was scheduled for follow up at the time of discharge. Echocardiogram was completed and showed LVEF 60-65% with some aortic root dilation. Patient was also evaluated by physical therapy while admitted with no follow up recommendations. He did not require supplemental oxygen  upon discharge.   The patient states since discharge he is no longer drinking alcohol and is not currently smoking.  He states his breathing is improved and he is having no active complaints.  He does complain of some left chronic hip pain.  The patient is maintaining Eliquis and will need patient assistance for this.  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  The patient has great deal of difficulty with any type of mobilization and is seen orthopedics and they recommended a hip replacement he wishes to defer this for now.  He is on 5 mg of hydrocodone and acetaminophen 3 times daily with some relief.  He has run out of his 20 pills he was given on 15 June. Patient has no prior history of drug use substance use alcohol use.  No psychiatric history.  No strong positive family history.  His opioid use risk is low based on the opioid risk scale  Patient maintains apixaban twice daily doing well with this without bleeding.  The patient has not gone back into atrial fibrillation since his last visit.  The patient maintains a good blood pressure and good pulse rate at this time.  Note metabolic panel CBC  were normal hepatitis C negative at last visit.  Patient received a cortisone injection left hip with minimal relief.  He is considering the hip replacement wishes to hold off at this time.  He does have an elevated BMI and we did discuss need for some weight loss.  The patient is yet copy of Covid vaccine and he is still pondering this.  He does know he  needs a Tdap at this visit.  Also needs colon cancer screening.  11/29 Patient is seen in follow-up with history of pulmonary embolism atrial fibrillation on apixaban for life 5 mg twice daily  Also severe arthritis of the left hip with chronic pain management.  The patient's been reluctant to undergo hip replacement for now he is giving it more consideration as the pain medicine is not holding his symptoms. On arrival this patient's blood pressure is 143/89  Patient is currently not smoking  07/05/20 Patient is seen in return follow-up for pulmonary embolism needing refills on medications needs to have labs checked at this visit. Patient does have severe degeneration at end-stage of the left hip orthopedics states he needs a hip replacement patient wishes to hold off on this he does have chronic pain from this and has been compliant with the Norco prescription given and urine drug screen has been negative in the past we need to ensure he has signed it pain contract at this visit   Patient states the Robaxin helps as well.  Patient occasionally takes Aleve and is not having any bleeding difficulties with this.  Patient needs to stay on anticoagulation for life needs follow-up labs at this visit.   Past Medical History:  Diagnosis Date  . Abnormal heart rhythm    "fibrillation of heart", diagnosed 4 years ago     Family History  Problem Relation Age of Onset  . Diabetes Mother      Social History   Socioeconomic History  . Marital status: Single    Spouse name: Not on file  . Number of children: Not on file  . Years of education: Not on file  . Highest education level: Not on file  Occupational History  . Not on file  Tobacco Use  . Smoking status: Former Smoker    Packs/day: 0.50    Years: 45.00    Pack years: 22.50    Types: Cigarettes, Cigars    Quit date: 07/30/2019    Years since quitting: 0.9  . Smokeless tobacco: Never Used  Substance and Sexual Activity  .  Alcohol use: Yes    Alcohol/week: 12.0 standard drinks    Types: 12 Cans of beer per week    Comment: 4 drinks per day on weekends   . Drug use: Never  . Sexual activity: Not on file  Other Topics Concern  . Not on file  Social History Narrative  . Not on file   Social Determinants of Health   Financial Resource Strain: Not on file  Food Insecurity: Not on file  Transportation Needs: Not on file  Physical Activity: Not on file  Stress: Not on file  Social Connections: Not on file  Intimate Partner Violence: Not on file     No Known Allergies   Outpatient Medications Prior to Visit  Medication Sig Dispense Refill  . apixaban (ELIQUIS) 5 MG TABS tablet Take 1 tablet (5 mg total) by mouth 2 (two) times daily. 60 tablet 0  . HYDROcodone-acetaminophen (NORCO/VICODIN) 5-325 MG tablet TAKE 1 TABLET BY MOUTH 3 (  THREE) TIMES DAILY AS NEEDED FOR MODERATE PAIN. 70 tablet 0  . methocarbamol (ROBAXIN) 500 MG tablet TAKE 1 TABLET (500 MG TOTAL) BY MOUTH EVERY 6 (SIX) HOURS AS NEEDED FOR MUSCLE SPASMS. 60 tablet 1   No facility-administered medications prior to visit.       Review of Systems  Constitutional: Negative.   HENT: Negative.   Eyes: Negative.   Respiratory: Negative.   Cardiovascular: Negative.   Gastrointestinal: Negative.   Genitourinary: Negative.   Musculoskeletal: Negative.   Skin: Negative.   Neurological: Negative.   Hematological: Does not bruise/bleed easily.  Psychiatric/Behavioral: Negative.        Objective:   Physical Exam  Vitals:   07/05/20 1000  BP: 124/66  Pulse: 87  SpO2: 97%  Weight: 234 lb 9.6 oz (106.4 kg)  Height: 6\' 2"  (1.88 m)    Gen: Pleasant, mild obesity in no distress,  normal affect  ENT: No lesions,  mouth clear,  oropharynx clear, no postnasal drip  Neck: No JVD, no TMG, no carotid bruits  Lungs: No use of accessory muscles, no dullness to percussion, clear without rales or rhonchi  Cardiovascular: RRR, heart sounds  normal, no murmur or gallops, no peripheral edema  Abdomen: soft and NT, no HSM,  BS normal  Musculoskeletal: No deformities, no cyanosis or clubbing  Neuro: alert, non focal  Skin: Warm, no lesions or rashes      Assessment & Plan:  I personally reviewed all images and lab data in the [facility] system as well as any outside material available during this office visit and agree with the  radiology impressions.   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  Refills on Eliquis sent to the pharmacy  Check metabolic panel and blood counts  Arthritis of left hip Chronic pain from end-stage arthritis of left hip needs a hip replacement  Encouraged to follow-up with orthopedics and to obtain [place] discount    Chronic pain syndrome Patient will sign pain management contract patient's opiate risk score is low and refills given he has been compliant with refills and is not overtaking medications there is no multiple users and drug database is checked drug screen will be obtained today  Chronic anticoagulation Follow-up with CBC continue apixaban   Mccall was seen today for follow-up.  Diagnoses and all orders for this visit:  Pulmonary embolism without acute cor pulmonale, unspecified chronicity, unspecified pulmonary embolism type (HCC)  Chronic pain syndrome Truett Perna 11+Oxyco+Alc+Crt-Bund  Chronic anticoagulation -     Basic Metabolic Panel -     CBC with Differential/Platelet  Arthritis of left hip  Other orders -     Discontinue: HYDROcodone-acetaminophen (NORCO/VICODIN) 5-325 MG tablet; TAKE 1 TABLET BY MOUTH 3 (THREE) TIMES DAILY AS NEEDED FOR MODERATE PAIN. -     methocarbamol (ROBAXIN) 500 MG tablet; TAKE 1 TABLET (500 MG TOTAL) BY MOUTH EVERY 6 (SIX) HOURS AS NEEDED FOR MUSCLE SPASMS. -     apixaban (ELIQUIS) 5 MG TABS tablet; Take 1 tablet (5 mg total) by mouth 2 (two) times  daily. -     Discontinue: HYDROcodone-acetaminophen (NORCO/VICODIN) 5-325 MG tablet; TAKE 1 TABLET BY MOUTH 3 (THREE) TIMES DAILY AS NEEDED FOR MODERATE PAIN. -     HYDROcodone-acetaminophen (NORCO/VICODIN) 5-325 MG tablet; TAKE 1 TABLET BY MOUTH 3 (THREE) TIMES DAILY AS NEEDED FOR MODERATE PAIN.  35 min spent

## 2020-07-05 ENCOUNTER — Encounter: Payer: Self-pay | Admitting: Critical Care Medicine

## 2020-07-05 ENCOUNTER — Telehealth: Payer: Self-pay

## 2020-07-05 ENCOUNTER — Telehealth: Payer: Self-pay | Admitting: Critical Care Medicine

## 2020-07-05 ENCOUNTER — Other Ambulatory Visit (HOSPITAL_COMMUNITY): Payer: Self-pay

## 2020-07-05 ENCOUNTER — Other Ambulatory Visit: Payer: Self-pay

## 2020-07-05 ENCOUNTER — Ambulatory Visit: Payer: Self-pay | Attending: Critical Care Medicine | Admitting: Critical Care Medicine

## 2020-07-05 VITALS — BP 124/66 | HR 87 | Ht 74.0 in | Wt 234.6 lb

## 2020-07-05 DIAGNOSIS — I2699 Other pulmonary embolism without acute cor pulmonale: Secondary | ICD-10-CM

## 2020-07-05 DIAGNOSIS — M1612 Unilateral primary osteoarthritis, left hip: Secondary | ICD-10-CM

## 2020-07-05 DIAGNOSIS — G894 Chronic pain syndrome: Secondary | ICD-10-CM

## 2020-07-05 DIAGNOSIS — Z7901 Long term (current) use of anticoagulants: Secondary | ICD-10-CM

## 2020-07-05 MED ORDER — HYDROCODONE-ACETAMINOPHEN 5-325 MG PO TABS
1.0000 | ORAL_TABLET | Freq: Three times a day (TID) | ORAL | 0 refills | Status: DC
  Filled 2020-07-05: qty 70, 24d supply, fill #0

## 2020-07-05 MED ORDER — HYDROCODONE-ACETAMINOPHEN 5-325 MG PO TABS: ORAL_TABLET | ORAL | 0 refills | Status: DC

## 2020-07-05 MED ORDER — APIXABAN 5 MG PO TABS
5.0000 mg | ORAL_TABLET | Freq: Two times a day (BID) | ORAL | 3 refills | Status: DC
  Filled 2020-07-05 – 2020-08-02 (×2): qty 60, 30d supply, fill #0
  Filled 2020-09-13: qty 180, 90d supply, fill #1

## 2020-07-05 MED ORDER — METHOCARBAMOL 500 MG PO TABS
ORAL_TABLET | Freq: Four times a day (QID) | ORAL | 1 refills | Status: DC | PRN
  Filled 2020-07-05: qty 60, 15d supply, fill #0

## 2020-07-05 NOTE — Assessment & Plan Note (Signed)
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

## 2020-07-05 NOTE — Telephone Encounter (Signed)
I cannot discuss CIIs with the pharmacy. They will need to speak directly to the provider. Rerouting to Dr. Delford Field.

## 2020-07-05 NOTE — Telephone Encounter (Signed)
PEC transfer a call from [facility] pharmacy regarding the patient  RX HYDROcodone-acetaminophen. The pharmacist states they usually only supply 15 tablets for 5 days for first time patient that have not been on this RX before. if you could please call the pharmacy back at [phone].

## 2020-07-05 NOTE — Assessment & Plan Note (Signed)
Patient will sign pain management contract patient's opiate risk score is low and refills given he has been compliant with refills and is not overtaking medications there is no multiple users and drug database is checked drug screen will be obtained today

## 2020-07-05 NOTE — Assessment & Plan Note (Signed)
Chronic pain from end-stage arthritis of left hip needs a hip replacement  Encouraged to follow-up with orthopedics and to obtain [place] discount

## 2020-07-05 NOTE — Patient Instructions (Signed)
Refills on your pain medicine sent to your [facility] pharmacy all other medications sent to our pharmacy here at the clinic  We will have you sign a pain management contract today  We will give you financial assistance paperwork to apply for the orange card or [place] discount  Follow-up with orthopedic surgery when you make a decision regarding hip replacement  Labs today include urine drug screen blood counts and metabolic profile  We will let you know the result of your colon cancer screening  Return to see Dr. Delford Field in 4 months

## 2020-07-05 NOTE — Assessment & Plan Note (Signed)
Follow-up with CBC continue apixaban

## 2020-07-05 NOTE — Telephone Encounter (Signed)
Pharmacy would like the nurse or doctor to call to discuss script for HYDROcodone-acetaminophen (NORCO/VICODIN) 5-325 MG tablet.  Stated that the patient has not had this before and they can only distribute 5-7 days.  Please call to discuss further at [phone]

## 2020-07-06 ENCOUNTER — Telehealth: Payer: Self-pay | Admitting: Critical Care Medicine

## 2020-07-06 LAB — CBC WITH DIFFERENTIAL/PLATELET
Basophils Absolute: 0 10*3/uL (ref 0.0–0.2)
Basos: 0 %
EOS (ABSOLUTE): 0.1 10*3/uL (ref 0.0–0.4)
Eos: 1 %
Hematocrit: 46.7 % (ref 37.5–51.0)
Hemoglobin: 14.9 g/dL (ref 13.0–17.7)
Immature Grans (Abs): 0 10*3/uL (ref 0.0–0.1)
Immature Granulocytes: 0 %
Lymphocytes Absolute: 1.7 10*3/uL (ref 0.7–3.1)
Lymphs: 36 %
MCH: 27.6 pg (ref 26.6–33.0)
MCHC: 31.9 g/dL (ref 31.5–35.7)
MCV: 87 fL (ref 79–97)
Monocytes Absolute: 0.5 10*3/uL (ref 0.1–0.9)
Monocytes: 10 %
Neutrophils Absolute: 2.5 10*3/uL (ref 1.4–7.0)
Neutrophils: 53 %
Platelets: 230 10*3/uL (ref 150–450)
RBC: 5.39 x10E6/uL (ref 4.14–5.80)
RDW: 14.7 % (ref 11.6–15.4)
WBC: 4.8 10*3/uL (ref 3.4–10.8)

## 2020-07-06 LAB — BASIC METABOLIC PANEL
BUN/Creatinine Ratio: 6 — ABNORMAL LOW (ref 10–24)
BUN: 7 mg/dL — ABNORMAL LOW (ref 8–27)
CO2: 22 mmol/L (ref 20–29)
Calcium: 10 mg/dL (ref 8.6–10.2)
Chloride: 104 mmol/L (ref 96–106)
Creatinine, Ser: 1.14 mg/dL (ref 0.76–1.27)
Glucose: 90 mg/dL (ref 65–99)
Potassium: 4.7 mmol/L (ref 3.5–5.2)
Sodium: 142 mmol/L (ref 134–144)
eGFR: 72 mL/min/{1.73_m2} (ref >59)

## 2020-07-06 NOTE — Telephone Encounter (Signed)
Let pharmacy know I sent the rx to another pharmacy

## 2020-07-06 NOTE — Telephone Encounter (Signed)
Pharmacist is calling again to get an update on her previous message.  States she has not heard from the doctor.  Please advise and call to discuss at [phone]

## 2020-07-06 NOTE — Telephone Encounter (Signed)
[facility] calling again to advise this pt has never gotten any controlled substance for pain. Following the CDC guidelines, unless it is for chronic pain, they are unable to fill but a 5 day supply. After that, if pt needs more, she will need a new Rx. If for chronic pain, please call and let pharmacy know.Patrick Warner will fill a 5 day supply. Please advise

## 2020-07-07 ENCOUNTER — Telehealth: Payer: Self-pay

## 2020-07-07 LAB — FECAL OCCULT BLOOD, IMMUNOCHEMICAL: Fecal Occult Bld: NEGATIVE

## 2020-07-07 NOTE — Telephone Encounter (Signed)
Contacted pt to go over lab results pt is aware and doesn't have any questions or concerns 

## 2020-07-07 NOTE — Telephone Encounter (Signed)
Wang from [facility] advised on the below and will cancel patient prescription.

## 2020-07-11 NOTE — Telephone Encounter (Signed)
error 

## 2020-07-12 ENCOUNTER — Telehealth: Payer: Self-pay | Admitting: Critical Care Medicine

## 2020-07-12 MED ORDER — HYDROCODONE-ACETAMINOPHEN 5-325 MG PO TABS: 1.0000 | ORAL_TABLET | Freq: Three times a day (TID) | ORAL | 0 refills | Status: DC

## 2020-07-12 NOTE — Telephone Encounter (Signed)
Requested medication (s) are due for refill today: yes  Requested medication (s) are on the active medication list: yes   Last refill:  07/05/2020  Future visit scheduled:no  Notes to clinic:  this refill cannot be delegated    Requested Prescriptions  Pending Prescriptions Disp Refills   HYDROcodone-acetaminophen (NORCO/VICODIN) 5-325 MG tablet 70 tablet 0    Sig: TAKE 1 TABLET BY MOUTH 3 (THREE) TIMES DAILY AS NEEDED FOR MODERATE PAIN.      There is no refill protocol information for this order

## 2020-07-12 NOTE — Telephone Encounter (Signed)
Medication Refill - Medication:   HYDROcodone-acetaminophen (NORCO/VICODIN) 5-325 MG tablet   Patient is all out of medication   Preferred Pharmacy (with phone number or street name): [facility] Phone:  [phone]  Fax:  [phone]       Agent: Please be advised that RX refills may take up to 3 business days. We ask that you follow-up with your pharmacy.

## 2020-07-13 ENCOUNTER — Other Ambulatory Visit (HOSPITAL_COMMUNITY): Payer: Self-pay

## 2020-07-13 ENCOUNTER — Other Ambulatory Visit: Payer: Self-pay

## 2020-07-13 LAB — DRUG SCREEN 764883 11+OXYCO+ALC+CRT-BUND
Amphetamines, Urine: NEGATIVE ng/mL
BENZODIAZ UR QL: NEGATIVE ng/mL
Barbiturate: NEGATIVE ng/mL
Cocaine (Metabolite): NEGATIVE ng/mL
Creatinine: 210.6 mg/dL (ref 20.0–300.0)
Ethanol: NEGATIVE %
Meperidine: NEGATIVE ng/mL
Methadone Screen, Urine: NEGATIVE ng/mL
Oxycodone/Oxymorphone, Urine: NEGATIVE ng/mL
Phencyclidine: NEGATIVE ng/mL
Propoxyphene: NEGATIVE ng/mL
Tramadol: NEGATIVE ng/mL
pH, Urine: 5.7 (ref 4.5–8.9)

## 2020-07-13 LAB — CANNABINOID CONFIRMATION, UR
CANNABINOIDS: POSITIVE — AB
Carboxy THC GC/MS Conf: 282 ng/mL

## 2020-07-13 LAB — OPIATES CONFIRMATION, URINE: Opiates: NEGATIVE ng/mL

## 2020-07-14 ENCOUNTER — Other Ambulatory Visit (HOSPITAL_COMMUNITY): Payer: Self-pay

## 2020-07-14 ENCOUNTER — Other Ambulatory Visit: Payer: Self-pay | Admitting: Critical Care Medicine

## 2020-07-14 MED ORDER — HYDROCODONE-ACETAMINOPHEN 5-325 MG PO TABS
1.0000 | ORAL_TABLET | Freq: Three times a day (TID) | ORAL | 0 refills | Status: DC
  Filled 2020-07-14 (×2): qty 70, 24d supply, fill #0

## 2020-07-14 NOTE — Progress Notes (Signed)
Refill on hydrocodone sent   PDMP checked

## 2020-07-15 ENCOUNTER — Telehealth: Payer: Self-pay

## 2020-07-15 NOTE — Telephone Encounter (Signed)
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 I gave this information to the pharmacist at [facility].

## 2020-07-15 NOTE — Telephone Encounter (Signed)
-----   Message from Storm Frisk, MD sent at 07/07/2020  6:25 AM EDT ----- Let pt know fecal occult Neg  no colon cancer.  Recheck one yr

## 2020-07-15 NOTE — Telephone Encounter (Signed)
[facility] is calling again regarding a script for Hydrocodone which they said they need to know if it is for chronic or acute pain.  Please advise and call to discuss further at [phone]

## 2020-07-15 NOTE — Telephone Encounter (Signed)
Patient name and DOB has been verified Patient was informed of lab results. Patient had no questions.  

## 2020-07-19 ENCOUNTER — Telehealth: Payer: Self-pay

## 2020-07-19 NOTE — Telephone Encounter (Signed)
This is for chronic pain.  I did put this in the note to pharmacy field.  He has bone on bone in his right hip.

## 2020-07-19 NOTE — Telephone Encounter (Signed)
Will route to PCP for review. 

## 2020-07-19 NOTE — Telephone Encounter (Signed)
Pharmacy was called and they had already gotten clarification for medication from [facility].

## 2020-07-19 NOTE — Telephone Encounter (Signed)
Copied from CRM [id number]. Topic: Quick Communication - Rx Refill/Question >> Jul 12, 2020  5:55 PM Aretta Nip wrote: Medication: HYDROcodone-acetaminophen (NORCO/VICODIN) 5-325 MG tablet 70 tablet 0 07/12/2020   Sig - Route: TAKE 1 TABLET BY MOUTH 3 (THREE) TIMES DAILY AS NEEDED FOR MODERATE PAIN. - Oral  Sent to pharmacy as: HYDROcodone-acetaminophen (NORCO/VICODIN) 5-325 MG tablet   Pharmacy called questioning this med @70  tablets. States that they can only do 5 days if Acute if Chronic give explanation for treatment. Fu with Pharmacist, calling but she is off tomorrow. [facility] [address], [place] - [address]  Phone:  [phone] Fax:  [phone]

## 2020-08-02 ENCOUNTER — Other Ambulatory Visit: Payer: Self-pay

## 2020-08-05 ENCOUNTER — Other Ambulatory Visit: Payer: Self-pay

## 2020-09-06 ENCOUNTER — Other Ambulatory Visit: Payer: Self-pay

## 2020-09-13 ENCOUNTER — Other Ambulatory Visit: Payer: Self-pay

## 2020-09-14 ENCOUNTER — Other Ambulatory Visit: Payer: Self-pay

## 2020-10-31 ENCOUNTER — Telehealth: Payer: Medicaid Other | Admitting: Critical Care Medicine

## 2020-11-01 ENCOUNTER — Encounter: Payer: Self-pay | Admitting: Critical Care Medicine

## 2020-11-01 ENCOUNTER — Ambulatory Visit: Payer: Self-pay | Attending: Critical Care Medicine | Admitting: Critical Care Medicine

## 2020-11-01 ENCOUNTER — Other Ambulatory Visit: Payer: Self-pay

## 2020-11-01 ENCOUNTER — Other Ambulatory Visit (HOSPITAL_COMMUNITY): Payer: Self-pay

## 2020-11-01 VITALS — BP 114/72 | HR 84 | Ht 74.0 in | Wt 230.2 lb

## 2020-11-01 DIAGNOSIS — Z7901 Long term (current) use of anticoagulants: Secondary | ICD-10-CM

## 2020-11-01 DIAGNOSIS — I2699 Other pulmonary embolism without acute cor pulmonale: Secondary | ICD-10-CM

## 2020-11-01 DIAGNOSIS — G894 Chronic pain syndrome: Secondary | ICD-10-CM

## 2020-11-01 DIAGNOSIS — M1612 Unilateral primary osteoarthritis, left hip: Secondary | ICD-10-CM

## 2020-11-01 MED ORDER — HYDROCODONE-ACETAMINOPHEN 5-325 MG PO TABS
1.0000 | ORAL_TABLET | Freq: Three times a day (TID) | ORAL | 0 refills | Status: AC
  Filled 2020-11-01: qty 70, 23d supply, fill #0

## 2020-11-01 MED ORDER — METHOCARBAMOL 500 MG PO TABS
ORAL_TABLET | Freq: Four times a day (QID) | ORAL | 1 refills | Status: AC | PRN
  Filled 2020-11-01 – 2021-06-30 (×2): qty 60, 15d supply, fill #0

## 2020-11-01 MED ORDER — HYDROCODONE-ACETAMINOPHEN 5-325 MG PO TABS
1.0000 | ORAL_TABLET | Freq: Three times a day (TID) | ORAL | 0 refills | Status: DC
  Filled 2020-11-01: qty 70, 24d supply, fill #0

## 2020-11-01 MED ORDER — APIXABAN 5 MG PO TABS
5.0000 mg | ORAL_TABLET | Freq: Two times a day (BID) | ORAL | 3 refills | Status: DC
  Filled 2020-11-01 – 2020-12-06 (×3): qty 60, 30d supply, fill #0
  Filled 2021-03-21: qty 180, 90d supply, fill #1

## 2020-11-01 NOTE — Progress Notes (Deleted)
Subjective:    Patient ID: Patrick Warner, male    DOB: Sep 11, 1955, 65 y.o.   MRN: [id number]  08/06/19 64 y.o.M  HFU for submassive PE  This patient was admitted between the fifth and 8 May for submassive pulmonary emboli.  Patient comes in today for post hospital follow-up visit.  Below is the discharge summary  Dcsummary:    Submassive Pulmonary Emboli Patient presented to the ED with SOB, was treated with supplental oxygen and underwent CXR with concern for CAP. He was treated with azithro and ctx for one time dosing but given that he was afebrile and without leukocytosis and CTA suggested pulmonary findings were potentially due to infarction from emboli, these antibiotics were not continued as patient was thought to not have pneumonia. Patient did report new hemoptysis in addition to his chronic smoker's cough. EKG on admission showed right bundle branch block and no atrial fibrillation nor signs of ischemia with troponin levels that were unremarkable at 14 and 14. CTA was completed and showed Extensive bilateral lobar, segmental, and subsegmental pulmonary emboli within the bilateral lower lobe pulmonary arteries with involvement of a segmental branchartery of the right upper lobe. CTA had RV to LV ratio of 1.0, usually seen with right heart strain as well as patchy consolidation and ground-glass opacity within the right lower lobe may have represented pneumonia versus pulmonary infarction.The patient was started on heparin drip and on 5/6, was transitioned to Eliquis. Patient had no PCP on admission so TOC was conuslted to assist in getting the patient established with [facility]. Patient was scheduled for follow up at the time of discharge. Echocardiogram was completed and showed LVEF 60-65% with some aortic root dilation. Patient was also evaluated by physical therapy while admitted with no follow up recommendations. He did not require supplemental oxygen  upon discharge.    The patient states since discharge he is no longer drinking alcohol and is not currently smoking.  He states his breathing is improved and he is having no active complaints.  He does complain of some left chronic hip pain.  The patient is maintaining Eliquis and will need patient assistance for this.  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  The patient has great deal of difficulty with any type of mobilization and is seen orthopedics and they recommended a hip replacement he wishes to defer this for now.  He is on 5 mg of hydrocodone and acetaminophen 3 times daily with some relief.  He has run out of his 20 pills he was given on 15 June. Patient has no prior history of drug use substance use alcohol use.  No psychiatric history.  No strong positive family history.  His opioid use risk is low based on the opioid risk scale  Patient maintains apixaban twice daily doing well with this without bleeding.  The patient has not gone back into atrial fibrillation since his last visit.  The patient maintains a good blood pressure and good pulse rate at this time.  Note metabolic  panel CBC were normal hepatitis C negative at last visit.  Patient received a cortisone injection left hip with minimal relief.  He is considering the hip replacement wishes to hold off at this time.  He does have an elevated BMI and we did discuss need for some weight loss.  The patient is yet copy of Covid vaccine and he is still pondering this.  He does know he  needs a Tdap at this visit.  Also needs colon cancer screening.  11/29 Patient is seen in follow-up with history of pulmonary embolism atrial fibrillation on apixaban for life 5 mg twice daily  Also severe arthritis of the left hip with chronic pain management.  The patient's been reluctant to undergo hip replacement for now he is giving it more consideration as the pain medicine is not holding his symptoms. On arrival this patient's blood pressure is 143/89  Patient is currently not smoking  07/05/20 Patient is seen in return follow-up for pulmonary embolism needing refills on medications needs to have labs checked at this visit. Patient does have severe degeneration at end-stage of the left hip orthopedics states he needs a hip replacement patient wishes to hold off on this he does have chronic pain from this and has been compliant with the Norco prescription given and urine drug screen has been negative in the past we need to ensure he has signed it pain contract at this visit   Patient states the Robaxin helps as well.  Patient occasionally takes Aleve and is not having any bleeding difficulties with this.  Patient needs to stay on anticoagulation for life needs follow-up labs at this visit.  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  Refills on Eliquis sent to the pharmacy  Check metabolic panel and blood counts  Arthritis of left hip Chronic pain from end-stage arthritis of left hip needs a hip replacement  Encouraged to follow-up with orthopedics and to obtain [place] discount    Chronic pain syndrome Patient will sign pain management contract patient's opiate risk score is low and refills given he has been compliant with refills and is not overtaking medications there is no multiple users and drug database is checked drug screen will be obtained today  Chronic  anticoagulation Follow-up with CBC continue apixaban   Prem was seen today for follow-up.  Diagnoses and all orders for this visit:  Pulmonary embolism without acute cor pulmonale, unspecified chronicity, unspecified pulmonary embolism type (HCC)  Chronic pain syndrome -     [id number]+Oxyco+Alc+Crt-Bund  Chronic anticoagulation -     Basic Metabolic Panel -     CBC with Differential/Platelet  Arthritis of left hip  Other orders -     Discontinue: HYDROcodone-acetaminophen (NORCO/VICODIN) 5-325 MG tablet; TAKE 1 TABLET BY MOUTH 3 (THREE) TIMES DAILY AS NEEDED FOR MODERATE PAIN. -     methocarbamol (ROBAXIN) 500 MG tablet; TAKE 1 TABLET (500 MG TOTAL) BY MOUTH EVERY 6 (SIX) HOURS AS NEEDED FOR MUSCLE SPASMS. -     apixaban (ELIQUIS) 5 MG TABS tablet; Take 1 tablet (5 mg total) by mouth 2 (two) times daily. -     Discontinue: HYDROcodone-acetaminophen (NORCO/VICODIN) 5-325 MG tablet; TAKE 1 TABLET BY MOUTH 3 (THREE) TIMES DAILY AS NEEDED FOR MODERATE PAIN. -     HYDROcodone-acetaminophen (NORCO/VICODIN) 5-325 MG tablet; TAKE 1 TABLET BY MOUTH 3 (THREE) TIMES DAILY AS NEEDED FOR MODERATE PAIN.  Past Medical History:  Diagnosis Date   Abnormal heart rhythm    "fibrillation of heart", diagnosed 4 years ago     Family History  Problem Relation Age of Onset   Diabetes Mother      Social History   Socioeconomic History   Marital status: Single    Spouse name: Not on file   Number of children: Not on file   Years of education: Not on file   Highest education level: Not on file  Occupational History   Not on file  Tobacco Use   Smoking status: Former    Packs/day: 0.50    Years: 45.00    Pack years: 22.50    Types: Cigarettes, Cigars    Quit date: 07/30/2019    Years since quitting: 1.2   Smokeless tobacco: Never  Substance and Sexual Activity   Alcohol use: Yes    Alcohol/week: 12.0 standard drinks    Types: 12 Cans of beer per week    Comment: 4 drinks per day  on weekends    Drug use: Never   Sexual activity: Not on file  Other Topics Concern   Not on file  Social History Narrative   Not on file   Social Determinants of Health   Financial Resource Strain: Not on file  Food Insecurity: Not on file  Transportation Needs: Not on file  Physical Activity: Not on file  Stress: Not on file  Social Connections: Not on file  Intimate Partner Violence: Not on file     No Known Allergies   Outpatient Medications Prior to Visit  Medication Sig Dispense Refill   apixaban (ELIQUIS) 5 MG TABS tablet Take 1 tablet (5 mg total) by mouth 2 (two) times daily. 60 tablet 3   HYDROcodone-acetaminophen (NORCO/VICODIN) 5-325 MG tablet Take 1 tablet by mouth 3 (three) times daily. 70 tablet 0   methocarbamol (ROBAXIN) 500 MG tablet TAKE 1 TABLET (500 MG TOTAL) BY MOUTH EVERY 6 (SIX) HOURS AS NEEDED FOR MUSCLE SPASMS. 60 tablet 1   No facility-administered medications prior to visit.       Review of Systems  Skin: Negative.       Objective:   Physical Exam There were no vitals filed for this visit.   Gen: Pleasant, mild obesity in no distress,  normal affect  ENT: No lesions,  mouth clear,  oropharynx clear, no postnasal drip  Neck: No JVD, no TMG, no carotid bruits  Lungs: No use of accessory muscles, no dullness to percussion, clear without rales or rhonchi  Cardiovascular: RRR, heart sounds normal, no murmur or gallops, no peripheral edema  Abdomen: soft and NT, no HSM,  BS normal  Musculoskeletal: No deformities, no cyanosis or clubbing  Neuro: alert, non focal  Skin: Warm, no lesions or rashes      Assessment & Plan:  I personally reviewed all images and lab data in the [facility] system as well as any outside material available during this office visit and agree with the  radiology impressions.   No problem-specific Assessment & Plan notes found for this encounter.   There are no diagnoses linked to this encounter. 35 min spent

## 2020-11-01 NOTE — Assessment & Plan Note (Signed)
On lifelong eliquis 5mg  BID for PE and a-fib. Will screen CBC today. Patient had normal physical exam and no red flags identified.

## 2020-11-01 NOTE — Assessment & Plan Note (Signed)
Resulting from severe left hip OA. Currently taking vicoden 5/325 as needed. He was told that today would be his last refill as he needs to schedule a hip replacement to address the main problem. Plan urine drug screen today.

## 2020-11-01 NOTE — Progress Notes (Signed)
Established Patient Office Visit  Subjective:  Patient ID: Patrick Warner, male    DOB: 1955/05/23  Age: 65 y.o. MRN: [id number]  CC:  Chief Complaint  Patient presents with   Medication Refill    HPI  [place] presents for a 4 month follow up. He has been taking his eliquis BID due to a recent history of pulmonary embolism. He reports he is doing well and denies gross hematuria, hematochezia or epistaxis He is on eliquis 86m BID for life due to clot burden and comorbid Atrial fibrillation. He is not on rate control drug.   He currently does not have insurance but says his Medicaid will become active November 1st. Plan to refer to orthopedics for left hip replacement during that time. He continues to take vicoden and methocarbamol as needed. He does not take every day but spreads out his prescription to last until his next follow up. Denies any new acute pains or trauma.   Past Medical History:  Diagnosis Date   Abnormal heart rhythm    "fibrillation of heart", diagnosed 4 years ago    Past Surgical History:  Procedure Laterality Date   CARDIOVERSION     around 2017    Family History  Problem Relation Age of Onset   Diabetes Mother     Social History   Socioeconomic History   Marital status: Single    Spouse name: Not on file   Number of children: Not on file   Years of education: Not on file   Highest education level: Not on file  Occupational History   Not on file  Tobacco Use   Smoking status: Former    Packs/day: 0.50    Years: 45.00    Pack years: 22.50    Types: Cigarettes, Cigars    Quit date: 07/30/2019    Years since quitting: 1.2   Smokeless tobacco: Never  Substance and Sexual Activity   Alcohol use: Yes    Alcohol/week: 12.0 standard drinks    Types: 12 Cans of beer per week    Comment: 4 drinks per day on weekends    Drug use: Never   Sexual activity: Not on file  Other Topics Concern   Not on file  Social History Narrative    Not on file   Social Determinants of Health   Financial Resource Strain: Not on file  Food Insecurity: Not on file  Transportation Needs: Not on file  Physical Activity: Not on file  Stress: Not on file  Social Connections: Not on file  Intimate Partner Violence: Not on file    Outpatient Medications Prior to Visit  Medication Sig Dispense Refill   apixaban (ELIQUIS) 5 MG TABS tablet Take 1 tablet (5 mg total) by mouth 2 (two) times daily. 60 tablet 3   HYDROcodone-acetaminophen (NORCO/VICODIN) 5-325 MG tablet Take 1 tablet by mouth 3 (three) times daily. 70 tablet 0   methocarbamol (ROBAXIN) 500 MG tablet TAKE 1 TABLET (500 MG TOTAL) BY MOUTH EVERY 6 (SIX) HOURS AS NEEDED FOR MUSCLE SPASMS. 60 tablet 1   No facility-administered medications prior to visit.    No Known Allergies  ROS Review of Systems  Constitutional:  Negative for chills, fatigue and fever.  HENT: Negative.    Eyes: Negative.   Respiratory: Negative.  Negative for cough and shortness of breath.   Cardiovascular: Negative.  Negative for chest pain, palpitations and leg swelling.  Gastrointestinal: Negative.   Endocrine: Negative.   Genitourinary: Negative.  Musculoskeletal:  Positive for arthralgias and back pain.  Neurological: Negative.   Psychiatric/Behavioral: Negative.       Objective:    Physical Exam Constitutional:      General: He is not in acute distress.    Appearance: Normal appearance. He is normal weight.  HENT:     Head: Normocephalic.     Mouth/Throat:     Mouth: Mucous membranes are moist.     Pharynx: Oropharynx is clear.  Eyes:     Pupils: Pupils are equal, round, and reactive to light.  Cardiovascular:     Rate and Rhythm: Normal rate. Rhythm irregular.     Pulses: Normal pulses.     Heart sounds: Normal heart sounds.     Comments: No split S2  Pulmonary:     Effort: Pulmonary effort is normal.     Breath sounds: Normal breath sounds.  Abdominal:     General: Bowel  sounds are normal.     Palpations: Abdomen is soft.     Tenderness: There is no abdominal tenderness.     Hernia: No hernia is present.  Musculoskeletal:     Right lower leg: No edema.     Left lower leg: No edema.  Skin:    General: Skin is warm and dry.     Findings: No bruising.  Neurological:     General: No focal deficit present.     Mental Status: He is alert.  Psychiatric:        Mood and Affect: Mood normal.        Behavior: Behavior normal.    BP 114/72   Pulse 84   Ht _0  (1.88 m)   Wt 230 lb 3.2 oz (104.4 kg)   SpO2 99%   BMI 29.56 kg/m  Wt Readings from Last 3 Encounters:  11/01/20 230 lb 3.2 oz (104.4 kg)  07/05/20 234 lb 9.6 oz (106.4 kg)  02/22/20 240 lb 3.2 oz (109 kg)     Health Maintenance Due  Topic Date Due   Zoster Vaccines- Shingrix (1 of 2) Never done   COVID-19 Vaccine (4 - Booster for Moderna series) 10/30/2020   INFLUENZA VACCINE  10/24/2020    There are no preventive care reminders to display for this patient.  No results found for: TSH Lab Results  Component Value Date   WBC 4.8 07/05/2020   HGB 14.9 07/05/2020   HCT 46.7 07/05/2020   MCV 87 07/05/2020   PLT 230 07/05/2020   Lab Results  Component Value Date   NA 142 07/05/2020   K 4.7 07/05/2020   CO2 22 07/05/2020   GLUCOSE 90 07/05/2020   BUN 7 (L) 07/05/2020   CREATININE 1.14 07/05/2020   BILITOT 0.2 08/06/2019   ALKPHOS 150 (H) 08/06/2019   AST 32 08/06/2019   ALT 44 08/06/2019   PROT 8.0 08/06/2019   ALBUMIN 3.8 08/06/2019   CALCIUM 10.0 07/05/2020   ANIONGAP 8 07/31/2019   EGFR 72 07/05/2020   Lab Results  Component Value Date   CHOL 114 07/30/2019   Lab Results  Component Value Date   HDL 41 07/30/2019   Lab Results  Component Value Date   LDLCALC 62 07/30/2019   Lab Results  Component Value Date   TRIG 54 07/30/2019   Lab Results  Component Value Date   CHOLHDL 2.8 07/30/2019   Lab Results  Component Value Date   HGBA1C 5.4 07/30/2019       Assessment & Plan:  Problem List Items Addressed This Visit       Cardiovascular and Mediastinum   Pulmonary embolism ([place]) - Primary    Stable exam and no distress. Plan to continue eliquis 37m BID for life. Also has A-fib but not requiring rate control at this time. Denies hematuria, hematochezia or epistaxis. Will plan to order anticoagulant panel after November 1st when patient will have Medicaid.        Relevant Medications   apixaban (ELIQUIS) 5 MG TABS tablet   Other Relevant Orders   Comprehensive metabolic panel   CBC with Differential/Platelet     Musculoskeletal and Integument   Arthritis of left hip    Severe osteoarthritis of left hip is stable since previous visit. No new injury or acute symptoms. He continues to take vicoden 5/325 and methocarbamol 50346mprn. He spreads out his prescription until his follow up and asks for a refill today. Plan to refill today in addition to urine drug screen.        Relevant Medications   methocarbamol (ROBAXIN) 500 MG tablet   HYDROcodone-acetaminophen (NORCO/VICODIN) 5-325 MG tablet   Other Relevant Orders   Ambulatory referral to Orthopedic Surgery     Other   Chronic pain syndrome    Resulting from severe left hip OA. Currently taking vicoden 5/325 as needed. He was told that today would be his last refill as he needs to schedule a hip replacement to address the main problem. Plan urine drug screen today.        Relevant Medications   methocarbamol (ROBAXIN) 500 MG tablet   HYDROcodone-acetaminophen (NORCO/VICODIN) 5-325 MG tablet   Other Relevant Orders   [id number]+Oxyco+Alc+Crt-Bund   Chronic anticoagulation    On lifelong eliquis 46m33mID for PE and a-fib. Will screen CBC today. Patient had normal physical exam and no red flags identified.       Plan hypercoag studies once pt gets medicaid.  Refer ortho once has medicaid  Meds ordered this encounter  Medications   apixaban (ELIQUIS) 5 MG TABS tablet     Sig: Take 1 tablet (5 mg total) by mouth 2 (two) times daily.    Dispense:  60 tablet    Refill:  3    Needs patient assistance   DISCONTD: HYDROcodone-acetaminophen (NORCO/VICODIN) 5-325 MG tablet    Sig: Take 1 tablet by mouth 3 (three) times daily.    Dispense:  70 tablet    Refill:  0   methocarbamol (ROBAXIN) 500 MG tablet    Sig: TAKE 1 TABLET (500 MG TOTAL) BY MOUTH EVERY 6 (SIX) HOURS AS NEEDED FOR MUSCLE SPASMS.    Dispense:  60 tablet    Refill:  1   HYDROcodone-acetaminophen (NORCO/VICODIN) 5-325 MG tablet    Sig: Take 1 tablet by mouth 3 (three) times daily.    Dispense:  70 tablet    Refill:  0    [employer]    Follow-up: Return in about 3 months (around 02/01/2021).    PatAsencion NobleD

## 2020-11-01 NOTE — Assessment & Plan Note (Signed)
Stable exam and no distress. Plan to continue eliquis 5mg  BID for life. Also has A-fib but not requiring rate control at this time. Denies hematuria, hematochezia or epistaxis. Will plan to order anticoagulant panel after November 1st when patient will have Medicaid.

## 2020-11-01 NOTE — Assessment & Plan Note (Signed)
Severe osteoarthritis of left hip is stable since previous visit. No new injury or acute symptoms. He continues to take vicoden 5/325 and methocarbamol 500mg  prn. He spreads out his prescription until his follow up and asks for a refill today. Plan to refill today in addition to urine drug screen.

## 2020-11-01 NOTE — Patient Instructions (Signed)
Refills on your pain medicine sent to [facility] outpatient pharmacy refills on methocarbamol and Eliquis sent to our pharmacy here at the clinic  Labs today include metabolic panel blood counts and urine drug screen  We will make a referral to orthopedics for November return to see Dr. Delford Field in November

## 2020-11-02 LAB — CBC WITH DIFFERENTIAL/PLATELET
Basophils Absolute: 0 10*3/uL (ref 0.0–0.2)
Basos: 1 %
EOS (ABSOLUTE): 0.1 10*3/uL (ref 0.0–0.4)
Eos: 3 %
Hematocrit: 46.8 % (ref 37.5–51.0)
Hemoglobin: 15.5 g/dL (ref 13.0–17.7)
Immature Grans (Abs): 0 10*3/uL (ref 0.0–0.1)
Immature Granulocytes: 0 %
Lymphocytes Absolute: 2.1 10*3/uL (ref 0.7–3.1)
Lymphs: 43 %
MCH: 28.3 pg (ref 26.6–33.0)
MCHC: 33.1 g/dL (ref 31.5–35.7)
MCV: 86 fL (ref 79–97)
Monocytes Absolute: 0.5 10*3/uL (ref 0.1–0.9)
Monocytes: 11 %
Neutrophils Absolute: 2 10*3/uL (ref 1.4–7.0)
Neutrophils: 42 %
Platelets: 238 10*3/uL (ref 150–450)
RBC: 5.47 x10E6/uL (ref 4.14–5.80)
RDW: 14.3 % (ref 11.6–15.4)
WBC: 4.8 10*3/uL (ref 3.4–10.8)

## 2020-11-02 LAB — COMPREHENSIVE METABOLIC PANEL
ALT: 18 IU/L (ref 0–44)
AST: 17 IU/L (ref 0–40)
Albumin/Globulin Ratio: 1.1 — ABNORMAL LOW (ref 1.2–2.2)
Albumin: 4 g/dL (ref 3.8–4.8)
Alkaline Phosphatase: 111 IU/L (ref 44–121)
BUN/Creatinine Ratio: 7 — ABNORMAL LOW (ref 10–24)
BUN: 7 mg/dL — ABNORMAL LOW (ref 8–27)
Bilirubin Total: 0.3 mg/dL (ref 0.0–1.2)
CO2: 23 mmol/L (ref 20–29)
Calcium: 10.4 mg/dL — ABNORMAL HIGH (ref 8.6–10.2)
Chloride: 106 mmol/L (ref 96–106)
Creatinine, Ser: 1.07 mg/dL (ref 0.76–1.27)
Globulin, Total: 3.7 g/dL (ref 1.5–4.5)
Glucose: 97 mg/dL (ref 65–99)
Potassium: 4.8 mmol/L (ref 3.5–5.2)
Sodium: 144 mmol/L (ref 134–144)
Total Protein: 7.7 g/dL (ref 6.0–8.5)
eGFR: 78 mL/min/{1.73_m2} (ref >59)

## 2020-11-07 LAB — DRUG SCREEN 764883 11+OXYCO+ALC+CRT-BUND
Amphetamines, Urine: NEGATIVE ng/mL
BENZODIAZ UR QL: NEGATIVE ng/mL
Barbiturate: NEGATIVE ng/mL
Cocaine (Metabolite): NEGATIVE ng/mL
Creatinine: 96.4 mg/dL (ref 20.0–300.0)
Meperidine: NEGATIVE ng/mL
Methadone Screen, Urine: NEGATIVE ng/mL
Oxycodone/Oxymorphone, Urine: NEGATIVE ng/mL
Phencyclidine: NEGATIVE ng/mL
Propoxyphene: NEGATIVE ng/mL
Tramadol: NEGATIVE ng/mL
pH, Urine: 5.5 (ref 4.5–8.9)

## 2020-11-07 LAB — PANEL 764016: Ethanol: 0.021 %

## 2020-11-07 LAB — CANNABINOID CONFIRMATION, UR
CANNABINOIDS: POSITIVE — AB
Carboxy THC GC/MS Conf: 138 ng/mL

## 2020-11-07 LAB — OPIATES CONFIRMATION, URINE: Opiates: NEGATIVE ng/mL

## 2020-11-09 ENCOUNTER — Telehealth: Payer: Self-pay

## 2020-11-09 NOTE — Telephone Encounter (Signed)
-----   Message from Storm Frisk, MD sent at 11/02/2020  6:36 AM EDT ----- Let pt know labs all ok and normal  no change in medications

## 2020-11-09 NOTE — Telephone Encounter (Signed)
Pt was called and a VM was left informing patient of lab results. 

## 2020-11-25 IMAGING — DX DG CHEST 2V
2 series · 2 of 2 positions shown · non-contrast
Comparison: None.

CLINICAL DATA: Shortness of breath

EXAM:
CHEST - 2 VIEW

[w chest pa]
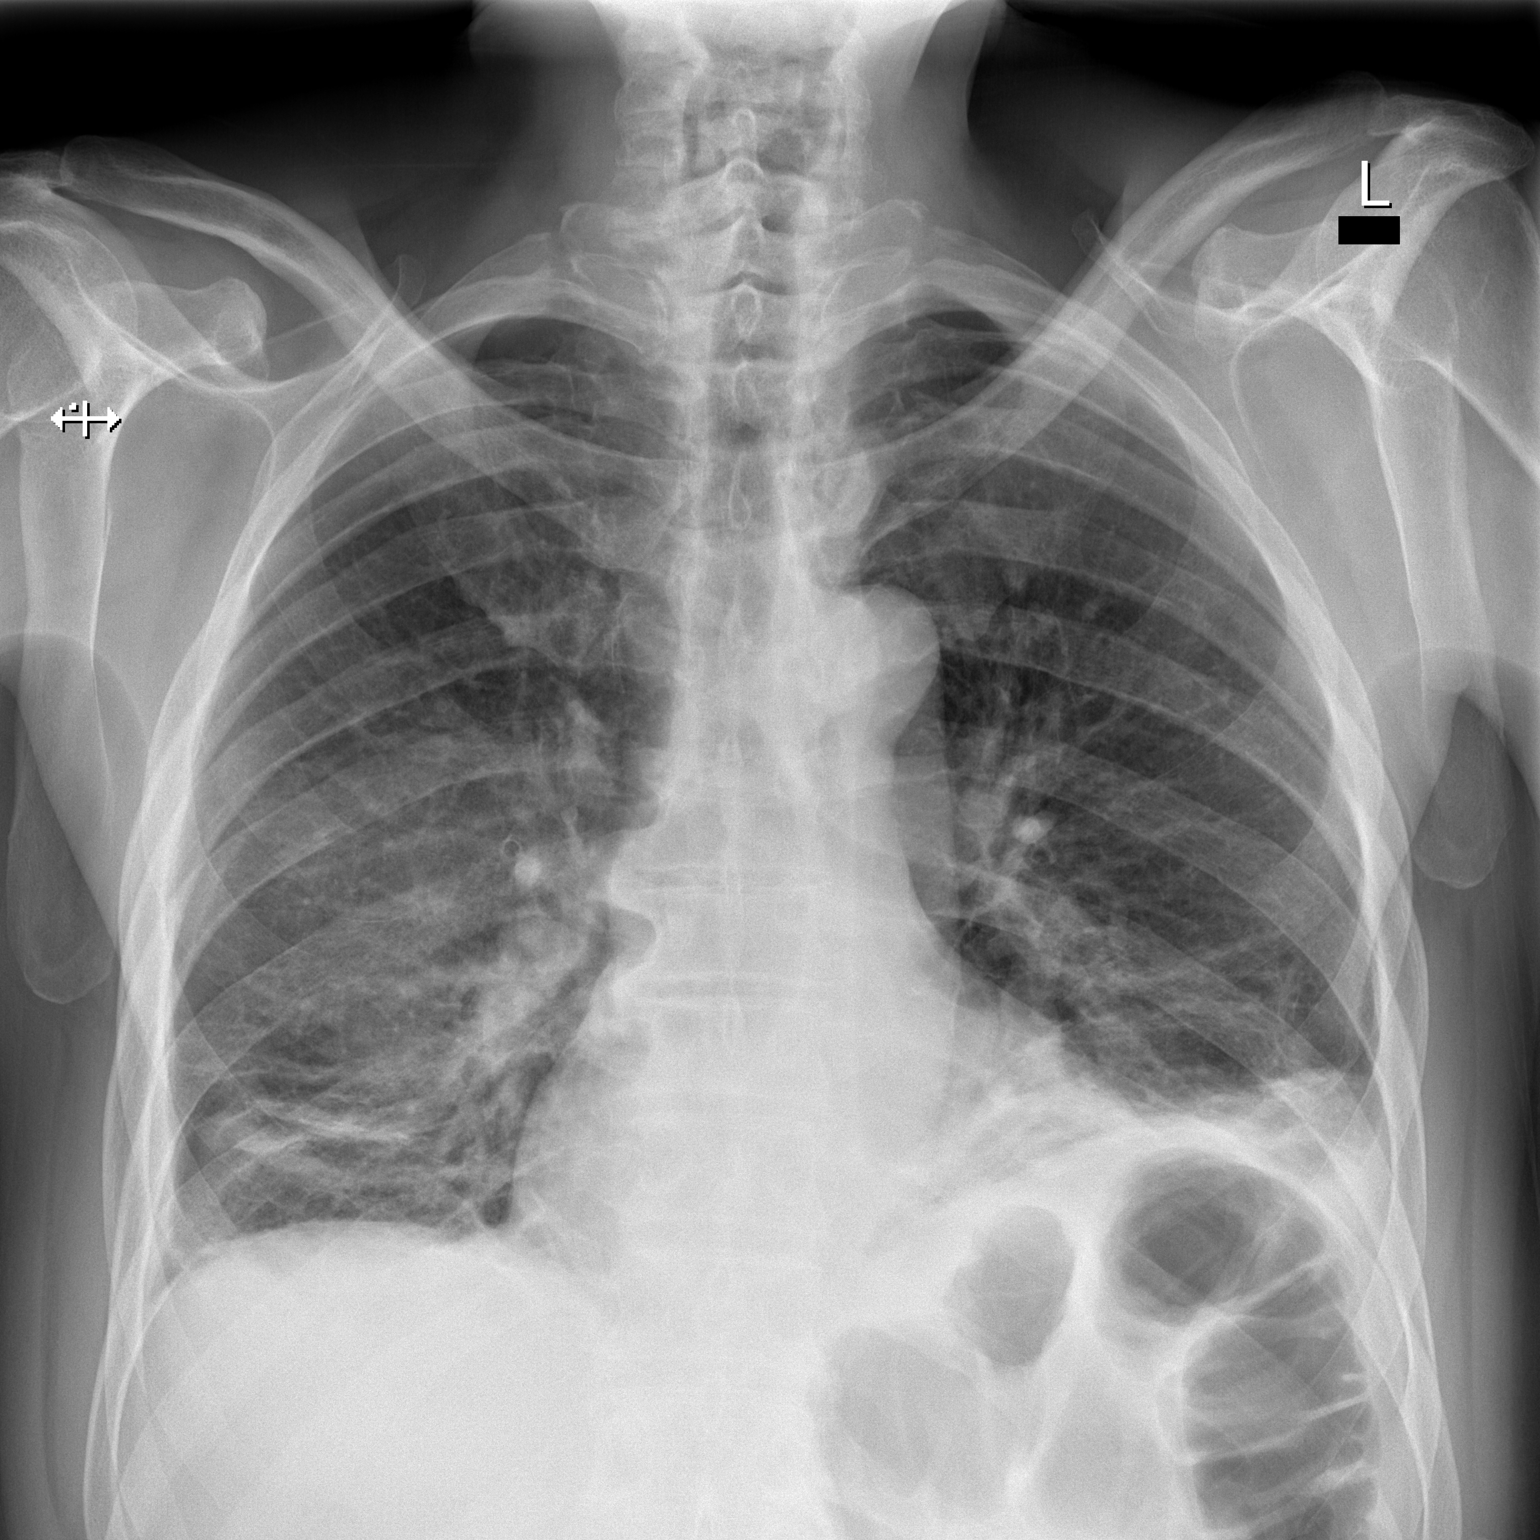

[w chest lat]
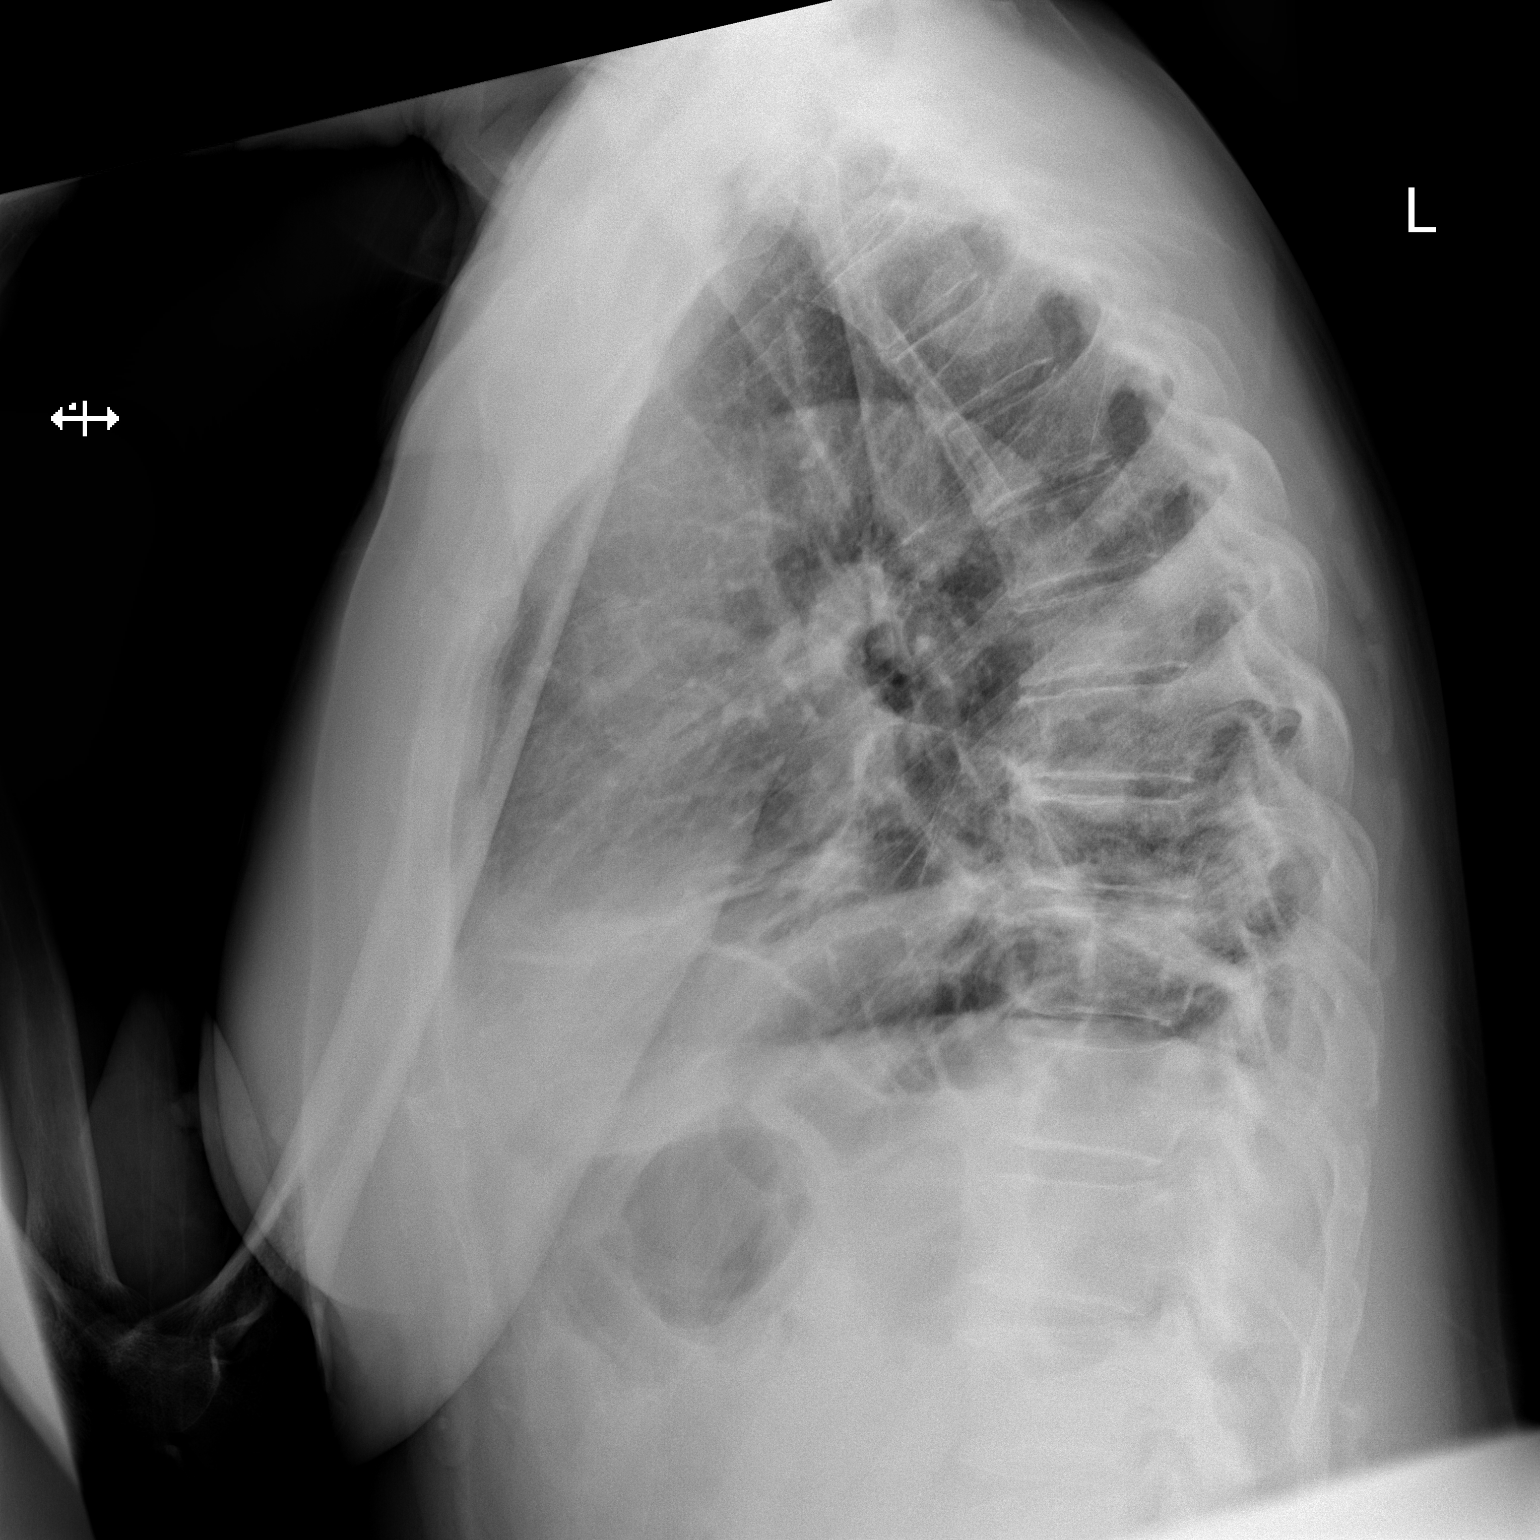

[2 of 2 positions shown; findings below may reference images not displayed]

FINDINGS: There is atelectatic change bilaterally with small left pleural
effusion. There is ill-defined opacity in the right mid and lower
lung regions. Heart size and pulmonary vascularity are normal. No
adenopathy. There is degenerative change in the thoracic spine.
IMPRESSION: Ill-defined airspace opacity in the right mid and lower lung regions
consistent pneumonia involving several lung segments. Bibasilar
atelectasis with small left pleural effusion.

Cardiac silhouette normal.  No adenopathy.

These findings may warrant correlation with [id number] status.

## 2020-12-06 ENCOUNTER — Other Ambulatory Visit: Payer: Self-pay

## 2020-12-15 ENCOUNTER — Other Ambulatory Visit: Payer: Self-pay

## 2020-12-16 ENCOUNTER — Other Ambulatory Visit: Payer: Self-pay

## 2020-12-21 ENCOUNTER — Emergency Department (HOSPITAL_COMMUNITY)
Admission: EM | Admit: 2020-12-21 | Discharge: 2020-12-22 | Disposition: A | Discharge status: Home / Self Care | Payer: Medicaid Other | Attending: Emergency Medicine | Admitting: Emergency Medicine

## 2020-12-21 ENCOUNTER — Encounter (HOSPITAL_COMMUNITY): Payer: Self-pay

## 2020-12-21 DIAGNOSIS — M109 Gout, unspecified: Secondary | ICD-10-CM | POA: Insufficient documentation

## 2020-12-21 DIAGNOSIS — Z5321 Procedure and treatment not carried out due to patient leaving prior to being seen by health care provider: Secondary | ICD-10-CM | POA: Insufficient documentation

## 2020-12-21 HISTORY — DX: Gout, unspecified: M10.9

## 2020-12-21 NOTE — ED Triage Notes (Signed)
Pt reports that he has a hx of Gout and it has been flaring up in his R wrist and R foot.

## 2020-12-21 NOTE — ED Provider Notes (Signed)
Emergency Medicine Provider Triage Evaluation Note  Patrick Warner , a 65 y.o. male  was evaluated in triage.  Pt complains of possible gout flare up. Pt complains of pain, swelling, and redness to his R wrist and R ankle. He states this is where he typically gets gout flares. He does not have a PCP to follow up with. He has not tried anything for his symptoms.   Review of Systems  Positive: + redness, swelling, pain to R wrist and R ankle Negative: - fevers, chills  Physical Exam  BP (!) 145/98 (BP Location: Left Arm)   Pulse 75   Temp 98.6 F (37 C) (Oral)   Resp 16   SpO2 100%  Gen:   Awake, no distress   Resp:  Normal effort  MSK:   Moves extremities without difficulty  Other:  Swelling and increased warmth to the R wrist with limited ROM. + swelling and redness to R ankle with limited ROM. 2+ radial pulse and 2+ DP pulse.   Medical Decision Making  Medically screening exam initiated at 7:35 PM.  Appropriate orders placed.  [name] was informed that the remainder of the evaluation will be completed by another provider, this initial triage assessment does not replace that evaluation, and the importance of remaining in the ED until their evaluation is complete.     Tanda Rockers, PA-C 12/21/20 1936    Bethann Berkshire, MD 12/21/20 2141

## 2020-12-22 ENCOUNTER — Encounter (HOSPITAL_COMMUNITY): Payer: Self-pay

## 2020-12-22 ENCOUNTER — Other Ambulatory Visit: Payer: Self-pay

## 2020-12-22 ENCOUNTER — Ambulatory Visit (HOSPITAL_COMMUNITY)
Admission: EM | Admit: 2020-12-22 | Discharge: 2020-12-22 | Disposition: A | Discharge status: Home / Self Care | Payer: Medicaid Other | Attending: Student | Admitting: Student

## 2020-12-22 DIAGNOSIS — Z7901 Long term (current) use of anticoagulants: Secondary | ICD-10-CM

## 2020-12-22 DIAGNOSIS — M10041 Idiopathic gout, right hand: Secondary | ICD-10-CM

## 2020-12-22 MED ORDER — PREDNISONE 50 MG PO TABS: 50.0000 mg | ORAL_TABLET | Freq: Every day | ORAL | 0 refills | Status: AC

## 2020-12-22 MED ORDER — METHYLPREDNISOLONE SODIUM SUCC 125 MG IJ SOLR
INTRAMUSCULAR | Status: AC
  Filled 2020-12-22: qty 2

## 2020-12-22 MED ORDER — METHYLPREDNISOLONE SODIUM SUCC 125 MG IJ SOLR
60.0000 mg | Freq: Once | INTRAMUSCULAR | Status: AC
  Administered 2020-12-22: 60 mg via INTRAMUSCULAR

## 2020-12-22 NOTE — ED Provider Notes (Signed)
[facility]    CSN: [id number] Arrival date & time: 12/22/20  1837      History   Chief Complaint Chief Complaint  Patient presents with   Gout    HPI Patrick Warner is a 65 y.o. male presenting with gout right hand.  Medical history gout, A. fib on anticoagulation, chronic pain syndrome, pulmonary embolism.  States that current gout has been going on for few days, happened after drinking beer.  Denies red meat, seafood.  Denies pain or injury elsewhere.  HPI  Past Medical History:  Diagnosis Date   Abnormal heart rhythm    "fibrillation of heart", diagnosed 4 years ago   Gout     Patient Active Problem List   Diagnosis Date Noted   Chronic pain syndrome 02/22/2020   Chronic anticoagulation 02/22/2020   Arthritis of left hip 09/24/2019   BMI 28.0-28.9,adult 09/24/2019   Pulmonary embolism (HCC) 07/29/2019   Hx of atrial fibrillation without current medication     Past Surgical History:  Procedure Laterality Date   CARDIOVERSION     around 2017       Home Medications    Prior to Admission medications   Medication Sig Start Date End Date Taking? Authorizing Provider  predniSONE (DELTASONE) 50 MG tablet Take 1 tablet (50 mg total) by mouth daily for 5 days. 12/22/20 12/27/20 Yes Rhys Martini, PA-C  apixaban (ELIQUIS) 5 MG TABS tablet Take 1 tablet (5 mg total) by mouth 2 (two) times daily. 11/01/20   Storm Frisk, MD  HYDROcodone-acetaminophen (NORCO/VICODIN) 5-325 MG tablet Take 1 tablet by mouth 3 (three) times daily. 11/01/20   Storm Frisk, MD  methocarbamol (ROBAXIN) 500 MG tablet TAKE 1 TABLET (500 MG TOTAL) BY MOUTH EVERY 6 (SIX) HOURS AS NEEDED FOR MUSCLE SPASMS. 11/01/20 11/01/21  Storm Frisk, MD    Family History Family History  Problem Relation Age of Onset   Diabetes Mother     Social History Social History   Tobacco Use   Smoking status: Former    Packs/day: 0.50    Years: 45.00    Pack years: 22.50    Types:  Cigarettes, Cigars    Quit date: 07/30/2019    Years since quitting: 1.4   Smokeless tobacco: Never  Substance Use Topics   Alcohol use: Yes    Alcohol/week: 12.0 standard drinks    Types: 12 Cans of beer per week    Comment: 4 drinks per day on weekends    Drug use: Never     Allergies   Patient has no known allergies.   Review of Systems Review of Systems  Musculoskeletal:        R hand pain    Physical Exam Triage Vital Signs ED Triage Vitals  Enc Vitals Group     BP 12/22/20 1913 (!) 138/92     Pulse Rate 12/22/20 1913 71     Resp 12/22/20 1913 (!) 22     Temp 12/22/20 1913 98.5 F (36.9 C)     Temp Source 12/22/20 1913 Oral     SpO2 12/22/20 1913 97 %     Weight --      Height --      Head Circumference --      Peak Flow --      Pain Score 12/22/20 1911 10     Pain Loc --      Pain Edu? --      Excl. in GC? --  No data found.  Updated Vital Signs BP (!) 138/92 (BP Location: Left Arm)   Pulse 71   Temp 98.5 F (36.9 C) (Oral)   Resp (!) 22   SpO2 97%   Visual Acuity Right Eye Distance:   Left Eye Distance:   Bilateral Distance:    Right Eye Near:   Left Eye Near:    Bilateral Near:     Physical Exam Vitals reviewed.  Constitutional:      General: He is not in acute distress.    Appearance: Normal appearance. He is not ill-appearing or diaphoretic.  HENT:     Head: Normocephalic and atraumatic.  Cardiovascular:     Rate and Rhythm: Normal rate and regular rhythm.     Heart sounds: Normal heart sounds.  Pulmonary:     Effort: Pulmonary effort is normal.     Breath sounds: Normal breath sounds.  Musculoskeletal:     Comments: R hand- effusion and tenderness over radiocarpal joint and medial MCP joints. Sensation intact. No bony deformity. Radial pulse 2+, cap refill <2 seconds.   Skin:    General: Skin is warm.  Neurological:     General: No focal deficit present.     Mental Status: He is alert and oriented to person, place, and time.   Psychiatric:        Mood and Affect: Mood normal.        Behavior: Behavior normal.        Thought Content: Thought content normal.        Judgment: Judgment normal.     UC Treatments / Results  Labs (all labs ordered are listed, but only abnormal results are displayed) Labs Reviewed - No data to display  EKG   Radiology No results found.  Procedures Procedures (including critical care time)  Medications Ordered in UC Medications  methylPREDNISolone sodium succinate (SOLU-MEDROL) 125 mg/2 mL injection 60 mg (60 mg Intramuscular Given 12/22/20 1943)    Initial Impression / Assessment and Plan / UC Course  I have reviewed the triage vital signs and the nursing notes.  Pertinent labs & imaging results that were available during my care of the patient were reviewed by me and considered in my medical decision making (see chart for details).     This patient is a very pleasant 65 y.o. year old male presenting with gout following beer consumption. Afebrile, nontachy.  Long-term anticoagulation for A. fib and history PE, so we will treat with prednisone as below.  IM Solu-Medrol administered during visit at patient request. ED return precautions discussed. Patient verbalizes understanding and agreement.  .   Final Clinical Impressions(s) / UC Diagnoses   Final diagnoses:  Acute idiopathic gout of right hand  Long term current use of anticoagulant therapy     Discharge Instructions      -Prednisone 50mg  (1 pill) daily starting tomorrow -Limit beer, seafood, red meat   ED Prescriptions     Medication Sig Dispense Auth. Provider   predniSONE (DELTASONE) 50 MG tablet Take 1 tablet (50 mg total) by mouth daily for 5 days. 5 tablet , PA-C      PDMP not reviewed this encounter.   Rhys Martini, PA-C 12/22/20 2011

## 2020-12-22 NOTE — Discharge Instructions (Signed)
-  Prednisone 50mg  (1 pill) daily starting tomorrow -Limit beer, seafood, red meat

## 2021-01-31 ENCOUNTER — Ambulatory Visit: Payer: Self-pay | Admitting: Orthopaedic Surgery

## 2021-02-21 ENCOUNTER — Ambulatory Visit: Payer: Self-pay | Admitting: Orthopaedic Surgery

## 2021-03-13 ENCOUNTER — Other Ambulatory Visit: Payer: Self-pay

## 2021-03-21 ENCOUNTER — Other Ambulatory Visit: Payer: Self-pay

## 2021-03-23 ENCOUNTER — Other Ambulatory Visit: Payer: Self-pay

## 2021-05-31 ENCOUNTER — Other Ambulatory Visit: Payer: Self-pay

## 2021-06-30 ENCOUNTER — Other Ambulatory Visit: Payer: Self-pay

## 2021-06-30 ENCOUNTER — Other Ambulatory Visit: Payer: Self-pay | Admitting: Critical Care Medicine

## 2021-06-30 NOTE — Telephone Encounter (Signed)
Requested medications are due for refill today.  yes ? ?Requested medications are on the active medications list.  yes ? ?Last refill. 11/01/2020 #60 3 refills ? ?Future visit scheduled.   no ? ?Notes to clinic.  Per note form OV of 11/01/2020 pt was to rtc on 02/01/2021 for follow up.  ? ? ? ?Requested Prescriptions  ?Pending Prescriptions Disp Refills  ? apixaban (ELIQUIS) 5 MG TABS tablet 60 tablet 3  ?  Sig: Take 1 tablet (5 mg total) by mouth 2 (two) times daily.  ?  ? Hematology:  Anticoagulants - apixaban Passed - 06/30/2021 11:15 AM  ?  ?  Passed - PLT in normal range and within 360 days  ?  Platelets  ?Date Value Ref Range Status  ?11/01/2020 238 150 - 450 x10E3/uL Final  ?  ?  ?  ?  Passed - HGB in normal range and within 360 days  ?  Hemoglobin  ?Date Value Ref Range Status  ?11/01/2020 15.5 13.0 - 17.7 g/dL Final  ?  ?  ?  ?  Passed - HCT in normal range and within 360 days  ?  Hematocrit  ?Date Value Ref Range Status  ?11/01/2020 46.8 37.5 - 51.0 % Final  ?  ?  ?  ?  Passed - Cr in normal range and within 360 days  ?  Creatinine  ?Date Value Ref Range Status  ?11/01/2020 96.4 20.0 - 300.0 mg/dL Final  ? ?Creatinine, Ser  ?Date Value Ref Range Status  ?11/01/2020 1.07 0.76 - 1.27 mg/dL Final  ?  ?  ?  ?  Passed - AST in normal range and within 360 days  ?  AST  ?Date Value Ref Range Status  ?11/01/2020 17 0 - 40 IU/L Final  ?  ?  ?  ?  Passed - ALT in normal range and within 360 days  ?  ALT  ?Date Value Ref Range Status  ?11/01/2020 18 0 - 44 IU/L Final  ?  ?  ?  ?  Passed - Valid encounter within last 12 months  ?  Recent Outpatient Visits   ? ?      ? 8 months ago Pulmonary embolism without acute cor pulmonale, unspecified chronicity, unspecified pulmonary embolism type ([place])  ? [place] Elsie Stain, MD  ? 12 months ago Pulmonary embolism without acute cor pulmonale, unspecified chronicity, unspecified pulmonary embolism type ([place])  ? [place] Elsie Stain, MD  ? 1 year ago Pulmonary embolism without acute cor pulmonale, unspecified chronicity, unspecified pulmonary embolism type ([place])  ? [place] Elsie Stain, MD  ? 1 year ago Pulmonary embolism without acute cor pulmonale, unspecified chronicity, unspecified pulmonary embolism type ([place])  ? [place] Elsie Stain, MD  ? 1 year ago Acute pulmonary embolism without acute cor pulmonale, unspecified pulmonary embolism type ([place])  ? [place] Elsie Stain, MD  ? ?  ?  ? ?  ?  ?  ?  ?

## 2021-07-02 MED ORDER — APIXABAN 5 MG PO TABS
5.0000 mg | ORAL_TABLET | Freq: Two times a day (BID) | ORAL | 0 refills | Status: AC
  Filled 2021-07-02: qty 60, 30d supply, fill #0

## 2021-07-03 ENCOUNTER — Other Ambulatory Visit: Payer: Self-pay

## 2021-07-07 ENCOUNTER — Other Ambulatory Visit: Payer: Self-pay

## 2021-08-18 ENCOUNTER — Other Ambulatory Visit: Payer: Self-pay

## 2022-01-23 ENCOUNTER — Other Ambulatory Visit: Payer: Self-pay
